# Patient Record
Sex: Male | Born: 1990 | Race: Black or African American | Hispanic: No | Marital: Single | Smoking: Never smoker
Health system: Southern US, Community
[De-identification: ages and names within clinical notes are randomized; demographics above are authoritative.]

## PROBLEM LIST (undated history)

## (undated) DIAGNOSIS — I10 Essential (primary) hypertension: Secondary | ICD-10-CM

## (undated) DIAGNOSIS — J45909 Unspecified asthma, uncomplicated: Secondary | ICD-10-CM

## (undated) HISTORY — PX: SHOULDER ARTHROSCOPY: SHX128

## (undated) HISTORY — PX: ORIF TIBIA & FIBULA FRACTURES: SHX2131

---
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
## 2010-06-18 IMAGING — CR DG CHEST 1V PORT
1 series · 1 of 1 positions shown · non-contrast
Comparison: 12/20/2008

CLINICAL DATA: Evaluate pulmonary infiltrates, effusions.

PORTABLE CHEST - 1 VIEW

[view not recorded]
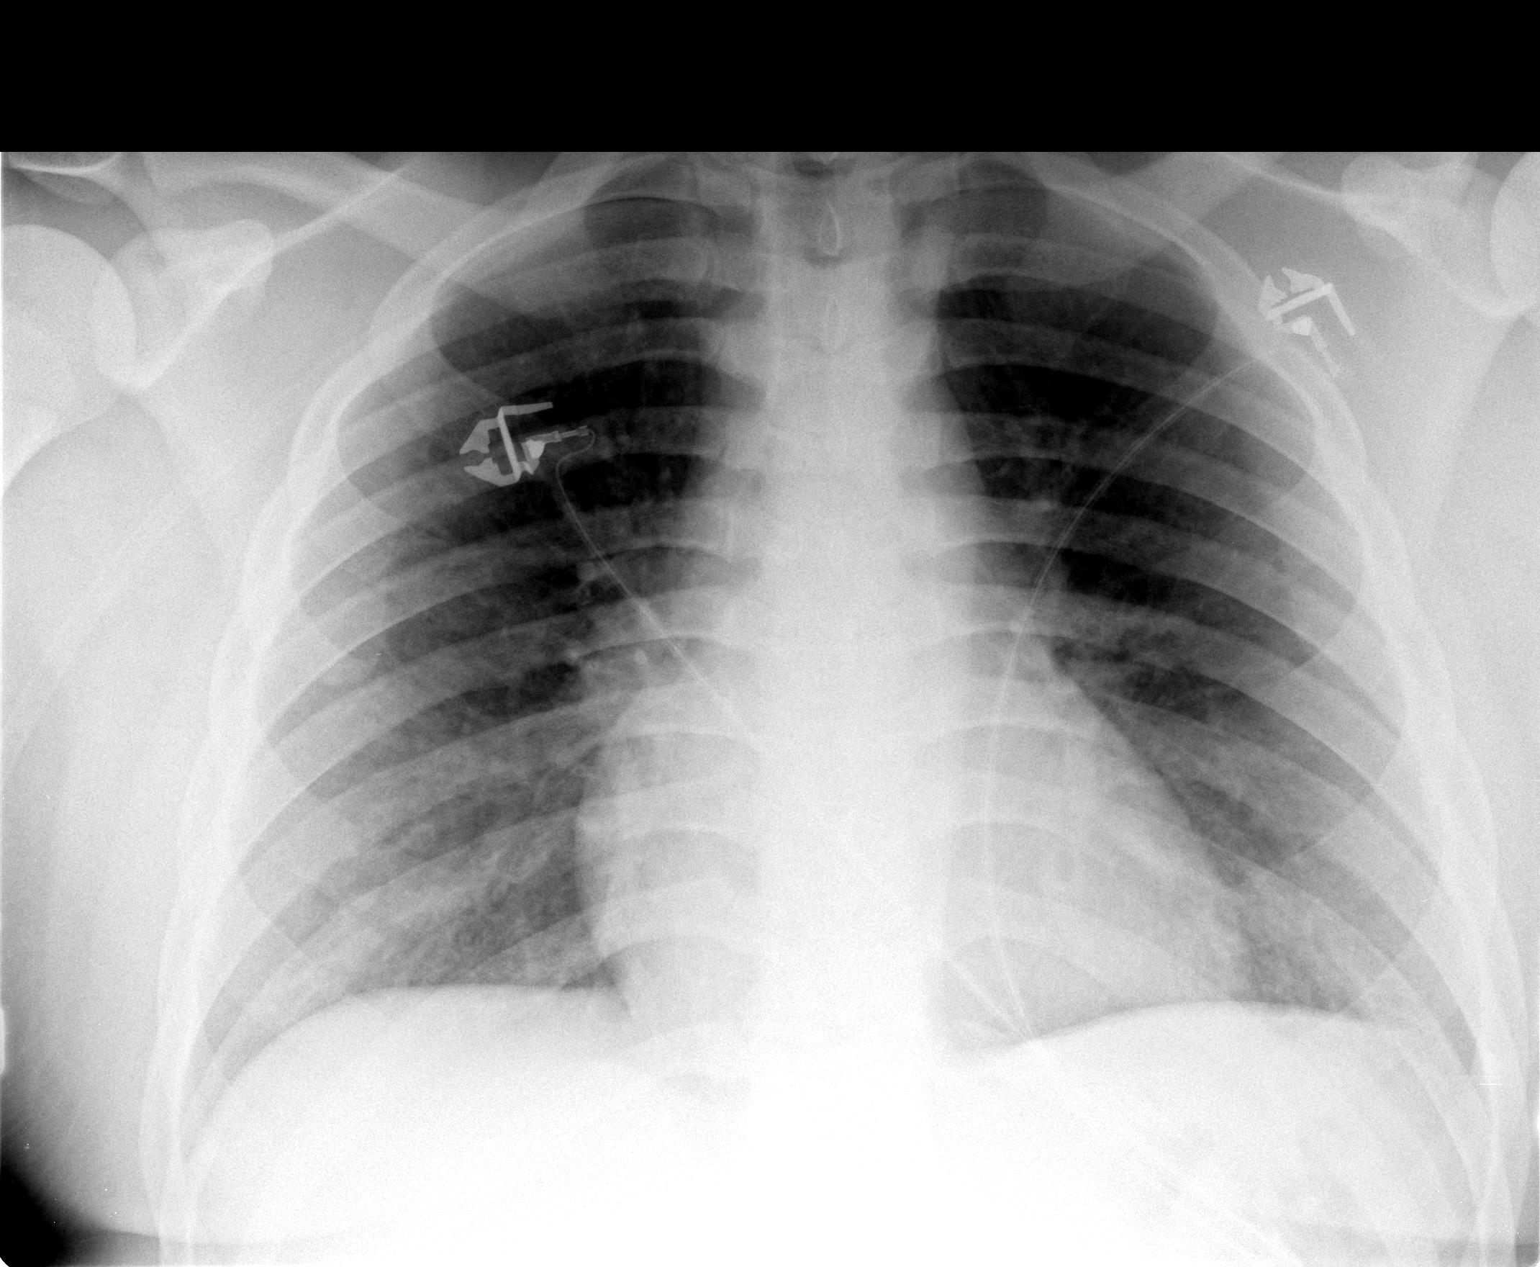

[1 of 1 positions shown; findings below may reference images not displayed]

FINDINGS: There is mild peribronchial thickening.  Previously noted
airspace disease has improved.  Heart is upper limits normal in
size.  No effusions or acute bony abnormality.
IMPRESSION: Improving bilateral airspace disease.

Heart upper limits of normal.

Mild bronchitic changes.

## 2010-07-15 LAB — URINALYSIS, ROUTINE W REFLEX MICROSCOPIC
Glucose, UA: NEGATIVE mg/dL
Hgb urine dipstick: NEGATIVE
Ketones, ur: NEGATIVE mg/dL
Protein, ur: NEGATIVE mg/dL

## 2010-08-03 LAB — POCT I-STAT 3, VENOUS BLOOD GAS (G3P V)
Acid-Base Excess: 4 mmol/L — ABNORMAL HIGH (ref 0.0–2.0)
Bicarbonate: 27.3 mEq/L — ABNORMAL HIGH (ref 20.0–24.0)
O2 Saturation: 100 %
TCO2: 28 mmol/L (ref 0–100)
pCO2, Ven: 37.2 mmHg — ABNORMAL LOW (ref 45.0–50.0)
pH, Ven: 7.473 — ABNORMAL HIGH (ref 7.250–7.300)
pO2, Ven: 155 mmHg — ABNORMAL HIGH (ref 30.0–45.0)

## 2010-08-03 LAB — CULTURE, BLOOD (ROUTINE X 2): Culture: NO GROWTH

## 2010-08-03 LAB — PROTIME-INR
INR: 1.2 (ref 0.00–1.49)
Prothrombin Time: 14.8 seconds (ref 11.6–15.2)

## 2010-08-03 LAB — EXPECTORATED SPUTUM ASSESSMENT W GRAM STAIN, RFLX TO RESP C

## 2010-08-03 LAB — D-DIMER, QUANTITATIVE: D-Dimer, Quant: 4.13 ug/mL-FEU — ABNORMAL HIGH (ref 0.00–0.48)

## 2010-08-03 LAB — CBC
HCT: 33.2 % — ABNORMAL LOW (ref 36.0–49.0)
HCT: 36 % (ref 36.0–49.0)
Hemoglobin: 12.4 g/dL (ref 12.0–16.0)
MCHC: 34.5 g/dL (ref 31.0–37.0)
MCV: 88.5 fL (ref 78.0–98.0)
Platelets: 190 10*3/uL (ref 150–400)
Platelets: 312 10*3/uL (ref 150–400)
RBC: 4.07 MIL/uL (ref 3.80–5.70)
RDW: 12.8 % (ref 11.4–15.5)
RDW: 12.9 % (ref 11.4–15.5)
WBC: 13.8 10*3/uL — ABNORMAL HIGH (ref 4.5–13.5)
WBC: 19.1 10*3/uL — ABNORMAL HIGH (ref 4.5–13.5)

## 2010-08-03 LAB — POCT I-STAT 3, ART BLOOD GAS (G3+)
Acid-Base Excess: 2 mmol/L (ref 0.0–2.0)
Bicarbonate: 26.6 mEq/L — ABNORMAL HIGH (ref 20.0–24.0)
O2 Saturation: 100 %
Patient temperature: 37
TCO2: 28 mmol/L (ref 0–100)
pCO2 arterial: 40.5 mmHg (ref 35.0–45.0)
pH, Arterial: 7.426 (ref 7.350–7.450)
pO2, Arterial: 238 mmHg — ABNORMAL HIGH (ref 80.0–100.0)

## 2010-08-03 LAB — LACTIC ACID, PLASMA: Lactic Acid, Venous: 1.4 mmol/L (ref 0.5–2.2)

## 2010-08-03 LAB — URINALYSIS, ROUTINE W REFLEX MICROSCOPIC
Ketones, ur: NEGATIVE mg/dL
Nitrite: NEGATIVE
Protein, ur: NEGATIVE mg/dL
Urobilinogen, UA: 0.2 mg/dL (ref 0.0–1.0)

## 2010-08-03 LAB — COMPREHENSIVE METABOLIC PANEL
ALT: 51 U/L (ref 0–53)
AST: 58 U/L — ABNORMAL HIGH (ref 0–37)
Albumin: 3.4 g/dL — ABNORMAL LOW (ref 3.5–5.2)
Alkaline Phosphatase: 86 U/L (ref 52–171)
BUN: 8 mg/dL (ref 6–23)
CO2: 27 mEq/L (ref 19–32)
Calcium: 8.4 mg/dL (ref 8.4–10.5)
Chloride: 101 mEq/L (ref 96–112)
Creatinine, Ser: 0.89 mg/dL (ref 0.4–1.5)
Glucose, Bld: 153 mg/dL — ABNORMAL HIGH (ref 70–99)
Potassium: 3.8 mEq/L (ref 3.5–5.1)
Sodium: 136 mEq/L (ref 135–145)
Total Bilirubin: 0.8 mg/dL (ref 0.3–1.2)
Total Protein: 6.8 g/dL (ref 6.0–8.3)

## 2010-08-03 LAB — POCT I-STAT 7, (LYTES, BLD GAS, ICA,H+H)
Acid-Base Excess: 4 mmol/L — ABNORMAL HIGH (ref 0.0–2.0)
Bicarbonate: 29.7 mEq/L — ABNORMAL HIGH (ref 20.0–24.0)
Calcium, Ion: 1.15 mmol/L (ref 1.12–1.32)
HCT: 31 % — ABNORMAL LOW (ref 36.0–49.0)
Hemoglobin: 10.5 g/dL — ABNORMAL LOW (ref 12.0–16.0)
O2 Saturation: 99 %
Patient temperature: 36.9
Potassium: 3.8 mEq/L (ref 3.5–5.1)
Sodium: 137 mEq/L (ref 135–145)
TCO2: 31 mmol/L (ref 0–100)
pCO2 arterial: 47.7 mmHg — ABNORMAL HIGH (ref 35.0–45.0)
pH, Arterial: 7.401 (ref 7.350–7.450)
pO2, Arterial: 135 mmHg — ABNORMAL HIGH (ref 80.0–100.0)

## 2010-08-03 LAB — C3 COMPLEMENT: C3 Complement: 190 mg/dL (ref 88–201)

## 2010-08-03 LAB — DIFFERENTIAL
Basophils Absolute: 0 10*3/uL (ref 0.0–0.1)
Basophils Relative: 0 % (ref 0–1)
Eosinophils Absolute: 0.3 10*3/uL (ref 0.0–1.2)
Eosinophils Relative: 2 % (ref 0–5)
Lymphocytes Relative: 4 % — ABNORMAL LOW (ref 24–48)
Lymphs Abs: 0.6 10*3/uL — ABNORMAL LOW (ref 1.1–4.8)
Monocytes Absolute: 0.8 10*3/uL (ref 0.2–1.2)
Monocytes Relative: 6 % (ref 3–11)
Neutro Abs: 12.1 10*3/uL — ABNORMAL HIGH (ref 1.7–8.0)
Neutrophils Relative %: 88 % — ABNORMAL HIGH (ref 43–71)

## 2010-08-03 LAB — APTT: aPTT: 25 seconds (ref 24–37)

## 2010-08-03 LAB — SALICYLATE LEVEL: Salicylate Lvl: <4 mg/dL (ref 2.8–20.0)

## 2010-08-03 LAB — HEPARIN ANTI-XA: Heparin LMW: 0.44 IU/mL

## 2010-08-03 LAB — FAT, QUALITATIVE, URINE: Fat, Qualitative Urine: NEGATIVE

## 2010-08-03 LAB — ABO/RH: ABO/RH(D): B POS

## 2010-08-03 LAB — LACTATE DEHYDROGENASE: LDH: 206 U/L (ref 94–250)

## 2010-08-03 LAB — ACETAMINOPHEN LEVEL: Acetaminophen (Tylenol), Serum: <10 ug/mL — ABNORMAL LOW (ref 10–30)

## 2010-08-03 LAB — SEDIMENTATION RATE: Sed Rate: 40 mm/hr — ABNORMAL HIGH (ref 0–16)

## 2010-09-10 NOTE — Discharge Summary (Signed)
NAME:  Kyle Doyle, Kyle Doyle NO.:  [id number]   MEDICAL RECORD NO.:  [id number]          PATIENT TYPE:  INP   LOCATION:  6148                         FACILITY:  [facility]   PHYSICIAN:  Dyann Ruddle, MDDATE OF BIRTH:  11-30-1990   DATE OF ADMISSION:  12/18/2008  DATE OF DISCHARGE:  12/23/2008                               DISCHARGE SUMMARY   REASON FOR HOSPITALIZATION:  Hemoptysis, respiratory distress, and chest  pain.   FINAL DIAGNOSIS:  Respiratory failure of unclear etiology.   BRIEF HOSPITAL COURSE:  Kyle Doyle is a previously healthy 20 year old  African American male who presented to the ED on December 18, 2008, with  fever, chest pain, shortness of breath, and oxygen saturation that were  66% on room air in the setting of being postop day #2 from an open  reduction internal fixation for left tib-fib fracture that he sustained  during a football game practice.  A spiral CT was done, given the  concern of pulmonary embolus.  CT was negative for filling defect,  although it did reveal multiple reticulonodular densities.  He was  treated with oxygen and max oxygen was nonrebreather which was weaned to  O2 face time of 60% which was weaned to 2 liters nasal cannula and  ultimately to room air.  He received 5 days of ceftriaxone, 5 days of  azithromycin, Solu-Medrol 40 mg IV twice daily which was weaned to  prednisone p.o. 40 mg once daily.  In addition, he received single doses  of Lasix and potassium chloride supplementation.  He received Lovenox  subcutaneous until discharge for DVT prophylaxis.  Hemoptysis slowly  resolved throughout hospital stay.  He had multiple labs that were not  consistent with an autoimmune etiology for his respiratory distress  including a normal ANCA, normal C3, C4, no urine sediments, normal  rheumatoid factor, normal ANA; however, it was unclear if his  improvement was secondary to steroids.  As far as infectious etiology  goes his  blood cultures remained no growth to date and his PPD is  negative.  The patient will have close outpatient follow-up to ensure  resolution of symptoms.   DISCHARGE WEIGHT:  129 kg.   DISCHARGE CONDITION:  Improved.   DISCHARGE DIET:  To resume prior diet.   DISCHARGE ACTIVITIES:  Per orthopaedics' request.   PROCEDURES AND OPERATIONS:  1. Spiral CT: negative for PE which showed multiple reticulonodular      densities.  2. Multiple chest x-rays which were consistent with pulmonary edema      much improved.  3. Echocardiogram, this was obtained for questionable cardiomegaly on      a chest x-ray.  The result is normal.  4. Lovenox therapy.   CONSULTANTS:  Adult Infectious Disease, Adult Pulmonology/Critical Care,  Adult Orthopaedics, Pediatric Cardiology, and Physical Therapy consults.   MEDICATIONS:  He has no continued home medications.   NEW MEDICATIONS:  1. Albuterol MDI with spacer 2 puffs 3-4 times a day as needed.  2. Prednisone per the following taper schedule, on day #0 which is the      day  of discharge, the patient is to take 40 mg by mouth twice      daily; on day #1, he is to take 30 mg by mouth twice daily; on day      #2, he is again to take 30 mg by mouth twice daily; on day #3, he      is to go down to 20 mg by mouth twice daily; on day #4, he will go      to 10 mg by mouth twice daily; on day #5, he will take 10 mg by      mouth only once daily; on day #6, he should not be taking any      further medications and at this point in time, he will discontinue      his GI prophylaxis.  3. Vicodin 5/500 1-2 tabs p.o. q.4 h. p.r.n. pain.  4. Pepcid 40 mg p.o. daily while on steroids.  5. Mucinex 200 or 400 mg every 4 hours for cough as needed.   DISCONTINUED MEDICATIONS:  None.   IMMUNIZATIONS:  He did not receive any immunizations.   PENDING RESULTS:  Blood culture which is no growth to 4 days at this  time.   FOLLOWUP ISSUES AND RECOMMENDATIONS:  Monitor  resolution of hemoptysis  and respiratory symptoms.   FOLLOWUP APPOINTMENTS:  Dr. Delford Field, Pulmonary Adult, his telephone  number for the office is [phone], the patient is to make an  appointment for 1 week after discharge.  Followup with his primary MD,  Dr. Susann Givens, his number is [phone], the patient will also call to  make an appointment sometime next week.  Last appointment is with Adult  Orthopaedics, Dr. Magnus Ivan, his office number is [phone], he will  follow up with Dr. Magnus Ivan in 1 to 2 weeks after discharge, the patient  will call to make an appointment.      Pediatrics Resident      Dyann Ruddle, MD  Electronically Signed    PR/MEDQ  D:  12/23/2008  T:  12/24/2008  Job:  [phone]

## 2011-03-17 ENCOUNTER — Encounter: Payer: Self-pay | Admitting: Family Medicine

## 2012-11-08 DIAGNOSIS — G4730 Sleep apnea, unspecified: Secondary | ICD-10-CM | POA: Insufficient documentation

## 2013-12-11 DIAGNOSIS — R55 Syncope and collapse: Secondary | ICD-10-CM | POA: Insufficient documentation

## 2013-12-11 DIAGNOSIS — I10 Essential (primary) hypertension: Secondary | ICD-10-CM | POA: Insufficient documentation

## 2013-12-13 DIAGNOSIS — I471 Supraventricular tachycardia, unspecified: Secondary | ICD-10-CM | POA: Insufficient documentation

## 2014-12-15 DIAGNOSIS — Z6841 Body Mass Index (BMI) 40.0 and over, adult: Secondary | ICD-10-CM | POA: Insufficient documentation

## 2014-12-16 DIAGNOSIS — K5901 Slow transit constipation: Secondary | ICD-10-CM | POA: Insufficient documentation

## 2016-09-26 ENCOUNTER — Emergency Department (HOSPITAL_COMMUNITY): Payer: Worker's Compensation

## 2016-09-26 ENCOUNTER — Emergency Department (HOSPITAL_COMMUNITY)
Admission: EM | Admit: 2016-09-26 | Discharge: 2016-09-26 | Disposition: A | Discharge status: Home / Self Care | Payer: Worker's Compensation | Attending: Emergency Medicine | Admitting: Emergency Medicine

## 2016-09-26 ENCOUNTER — Encounter (HOSPITAL_COMMUNITY): Payer: Self-pay

## 2016-09-26 DIAGNOSIS — R519 Headache, unspecified: Secondary | ICD-10-CM

## 2016-09-26 DIAGNOSIS — Y939 Activity, unspecified: Secondary | ICD-10-CM | POA: Insufficient documentation

## 2016-09-26 DIAGNOSIS — Z91010 Allergy to peanuts: Secondary | ICD-10-CM | POA: Diagnosis not present

## 2016-09-26 DIAGNOSIS — J45909 Unspecified asthma, uncomplicated: Secondary | ICD-10-CM | POA: Insufficient documentation

## 2016-09-26 DIAGNOSIS — R51 Headache: Secondary | ICD-10-CM | POA: Diagnosis not present

## 2016-09-26 DIAGNOSIS — Z79899 Other long term (current) drug therapy: Secondary | ICD-10-CM | POA: Diagnosis not present

## 2016-09-26 DIAGNOSIS — I10 Essential (primary) hypertension: Secondary | ICD-10-CM | POA: Insufficient documentation

## 2016-09-26 DIAGNOSIS — S3991XA Unspecified injury of abdomen, initial encounter: Secondary | ICD-10-CM | POA: Diagnosis present

## 2016-09-26 DIAGNOSIS — Y999 Unspecified external cause status: Secondary | ICD-10-CM | POA: Diagnosis not present

## 2016-09-26 DIAGNOSIS — R739 Hyperglycemia, unspecified: Secondary | ICD-10-CM | POA: Insufficient documentation

## 2016-09-26 DIAGNOSIS — Y929 Unspecified place or not applicable: Secondary | ICD-10-CM | POA: Diagnosis not present

## 2016-09-26 DIAGNOSIS — S301XXA Contusion of abdominal wall, initial encounter: Secondary | ICD-10-CM

## 2016-09-26 HISTORY — DX: Unspecified asthma, uncomplicated: J45.909

## 2016-09-26 HISTORY — DX: Essential (primary) hypertension: I10

## 2016-09-26 LAB — COMPREHENSIVE METABOLIC PANEL
ALT: 30 U/L (ref 17–63)
AST: 24 U/L (ref 15–41)
Albumin: 4.3 g/dL (ref 3.5–5.0)
Alkaline Phosphatase: 78 U/L (ref 38–126)
Anion gap: 8 (ref 5–15)
BILIRUBIN TOTAL: 0.5 mg/dL (ref 0.3–1.2)
BUN: 11 mg/dL (ref 6–20)
CO2: 30 mmol/L (ref 22–32)
CREATININE: 0.91 mg/dL (ref 0.61–1.24)
Calcium: 9.5 mg/dL (ref 8.9–10.3)
Chloride: 104 mmol/L (ref 101–111)
Glucose, Bld: 154 mg/dL — ABNORMAL HIGH (ref 65–99)
Potassium: 3.9 mmol/L (ref 3.5–5.1)
Sodium: 142 mmol/L (ref 135–145)
TOTAL PROTEIN: 7.9 g/dL (ref 6.5–8.1)

## 2016-09-26 LAB — CBC WITH DIFFERENTIAL/PLATELET
BASOS ABS: 0 10*3/uL (ref 0.0–0.1)
BASOS PCT: 0 %
EOS ABS: 0 10*3/uL (ref 0.0–0.7)
EOS PCT: 0 %
HCT: 42.5 % (ref 39.0–52.0)
HEMOGLOBIN: 14.3 g/dL (ref 13.0–17.0)
LYMPHS ABS: 2.4 10*3/uL (ref 0.7–4.0)
Lymphocytes Relative: 25 %
MCH: 28.8 pg (ref 26.0–34.0)
MCHC: 33.6 g/dL (ref 30.0–36.0)
MCV: 85.5 fL (ref 78.0–100.0)
Monocytes Absolute: 0.7 10*3/uL (ref 0.1–1.0)
Monocytes Relative: 8 %
NEUTROS PCT: 67 %
Neutro Abs: 6.4 10*3/uL (ref 1.7–7.7)
PLATELETS: 265 10*3/uL (ref 150–400)
RBC: 4.97 MIL/uL (ref 4.22–5.81)
RDW: 13.3 % (ref 11.5–15.5)
WBC: 9.6 10*3/uL (ref 4.0–10.5)

## 2016-09-26 LAB — URINALYSIS, ROUTINE W REFLEX MICROSCOPIC
BILIRUBIN URINE: NEGATIVE
Bacteria, UA: NONE SEEN
GLUCOSE, UA: NEGATIVE mg/dL
HGB URINE DIPSTICK: NEGATIVE
KETONES UR: NEGATIVE mg/dL
LEUKOCYTES UA: NEGATIVE
NITRITE: NEGATIVE
Protein, ur: 30 mg/dL — AB
SPECIFIC GRAVITY, URINE: 1.025 (ref 1.005–1.030)
pH: 5 (ref 5.0–8.0)

## 2016-09-26 MED ORDER — IOPAMIDOL (ISOVUE-300) INJECTION 61%: 100.0000 mL | Freq: Once | INTRAVENOUS | Status: DC | PRN

## 2016-09-26 MED ORDER — MORPHINE SULFATE (PF) 2 MG/ML IV SOLN
2.0000 mg | Freq: Once | INTRAVENOUS | Status: AC
  Administered 2016-09-26: 2 mg via INTRAVENOUS
  Filled 2016-09-26: qty 1

## 2016-09-26 MED ORDER — IOPAMIDOL (ISOVUE-300) INJECTION 61%
125.0000 mL | Freq: Once | INTRAVENOUS | Status: AC | PRN
  Administered 2016-09-26: 120 mL via INTRAVENOUS

## 2016-09-26 MED ORDER — ONDANSETRON HCL 4 MG/2ML IJ SOLN
4.0000 mg | Freq: Once | INTRAMUSCULAR | Status: AC
  Administered 2016-09-26: 4 mg via INTRAVENOUS
  Filled 2016-09-26: qty 2

## 2016-09-26 MED ORDER — IOPAMIDOL (ISOVUE-300) INJECTION 61%
INTRAVENOUS | Status: AC
  Filled 2016-09-26: qty 150

## 2016-09-26 MED ORDER — IBUPROFEN 800 MG PO TABS: 800.0000 mg | ORAL_TABLET | Freq: Three times a day (TID) | ORAL | 0 refills | Status: DC

## 2016-09-26 MED ORDER — MORPHINE SULFATE (PF) 4 MG/ML IV SOLN
4.0000 mg | Freq: Once | INTRAVENOUS | Status: AC
  Administered 2016-09-26: 4 mg via INTRAVENOUS
  Filled 2016-09-26: qty 1

## 2016-09-26 MED ORDER — HYDROCODONE-ACETAMINOPHEN 5-325 MG PO TABS: 1.0000 | ORAL_TABLET | ORAL | 0 refills | Status: DC | PRN

## 2016-09-26 MED ORDER — SODIUM CHLORIDE 0.9 % IV BOLUS (SEPSIS)
1000.0000 mL | Freq: Once | INTRAVENOUS | Status: AC
  Administered 2016-09-26: 1000 mL via INTRAVENOUS
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
## 2016-09-26 NOTE — ED Notes (Signed)
Pt called for triage, pt is in bathroom at this time.

## 2016-09-26 NOTE — Discharge Instructions (Signed)
Get your blood sugar rechecked by your PCP.

## 2016-09-26 NOTE — ED Notes (Signed)
Spoke with Kyle Doyle in HR and pt does not need drug screen.

## 2016-12-24 ENCOUNTER — Encounter: Payer: Self-pay | Admitting: Medical

## 2017-03-18 ENCOUNTER — Ambulatory Visit: Payer: BC Managed Care – PPO | Admitting: Family Medicine

## 2017-03-23 ENCOUNTER — Ambulatory Visit: Payer: BC Managed Care – PPO | Admitting: Family Medicine

## 2017-06-24 ENCOUNTER — Other Ambulatory Visit (HOSPITAL_COMMUNITY)
Admission: RE | Admit: 2017-06-24 | Discharge: 2017-06-24 | Disposition: A | Discharge status: Home / Self Care | Payer: BC Managed Care – PPO | Source: Ambulatory Visit | Attending: Family Medicine | Admitting: Family Medicine

## 2017-06-24 ENCOUNTER — Ambulatory Visit: Payer: BC Managed Care – PPO | Admitting: Family Medicine

## 2017-06-24 ENCOUNTER — Encounter: Payer: Self-pay | Admitting: Family Medicine

## 2017-06-24 VITALS — BP 146/84 | HR 75 | Temp 98.7°F | Ht 75.0 in | Wt 375.0 lb

## 2017-06-24 DIAGNOSIS — Z114 Encounter for screening for human immunodeficiency virus [HIV]: Secondary | ICD-10-CM | POA: Diagnosis not present

## 2017-06-24 DIAGNOSIS — M25562 Pain in left knee: Secondary | ICD-10-CM | POA: Diagnosis not present

## 2017-06-24 DIAGNOSIS — Z113 Encounter for screening for infections with a predominantly sexual mode of transmission: Secondary | ICD-10-CM | POA: Diagnosis present

## 2017-06-24 DIAGNOSIS — Z13220 Encounter for screening for lipoid disorders: Secondary | ICD-10-CM

## 2017-06-24 DIAGNOSIS — G4730 Sleep apnea, unspecified: Secondary | ICD-10-CM

## 2017-06-24 DIAGNOSIS — G8929 Other chronic pain: Secondary | ICD-10-CM

## 2017-06-24 DIAGNOSIS — R109 Unspecified abdominal pain: Secondary | ICD-10-CM

## 2017-06-24 DIAGNOSIS — R030 Elevated blood-pressure reading, without diagnosis of hypertension: Secondary | ICD-10-CM

## 2017-06-24 DIAGNOSIS — M25561 Pain in right knee: Secondary | ICD-10-CM

## 2017-06-24 LAB — COMPREHENSIVE METABOLIC PANEL
ALBUMIN: 4.3 g/dL (ref 3.5–5.2)
ALT: 28 U/L (ref 0–53)
AST: 21 U/L (ref 0–37)
Alkaline Phosphatase: 68 U/L (ref 39–117)
BILIRUBIN TOTAL: 0.4 mg/dL (ref 0.2–1.2)
BUN: 11 mg/dL (ref 6–23)
CO2: 28 mEq/L (ref 19–32)
CREATININE: 0.87 mg/dL (ref 0.40–1.50)
Calcium: 9.9 mg/dL (ref 8.4–10.5)
Chloride: 101 mEq/L (ref 96–112)
GFR: 136.15 mL/min (ref >60.00)
Glucose, Bld: 122 mg/dL — ABNORMAL HIGH (ref 70–99)
Potassium: 3.5 mEq/L (ref 3.5–5.1)
Sodium: 138 mEq/L (ref 135–145)
TOTAL PROTEIN: 7.7 g/dL (ref 6.0–8.3)

## 2017-06-24 LAB — CBC
HCT: 42.3 % (ref 39.0–52.0)
Hemoglobin: 14.4 g/dL (ref 13.0–17.0)
MCHC: 34.1 g/dL (ref 30.0–36.0)
MCV: 84.8 fl (ref 78.0–100.0)
Platelets: 308 10*3/uL (ref 150.0–400.0)
RBC: 4.99 Mil/uL (ref 4.22–5.81)
RDW: 13.2 % (ref 11.5–15.5)
WBC: 9 10*3/uL (ref 4.0–10.5)

## 2017-06-24 LAB — LIPID PANEL
CHOLESTEROL: 152 mg/dL (ref 0–200)
HDL: 42.7 mg/dL (ref >39.00)
LDL Cholesterol: 97 mg/dL (ref 0–99)
NONHDL: 109.4
Total CHOL/HDL Ratio: 4
Triglycerides: 61 mg/dL (ref 0.0–149.0)
VLDL: 12.2 mg/dL (ref 0.0–40.0)

## 2017-06-24 LAB — TSH: TSH: 2.35 u[IU]/mL (ref 0.35–4.50)

## 2017-06-24 LAB — POCT URINALYSIS DIP (MANUAL ENTRY)
BILIRUBIN UA: NEGATIVE
Glucose, UA: NEGATIVE mg/dL
Leukocytes, UA: NEGATIVE
NITRITE UA: NEGATIVE
PH UA: 5.5 (ref 5.0–8.0)
RBC UA: NEGATIVE
Spec Grav, UA: >=1.03 — AB (ref 1.010–1.025)
Urobilinogen, UA: 1 E.U./dL

## 2017-06-24 NOTE — Progress Notes (Signed)
Subjective:  Kyle Doyle is a 27 y.o. male who presents today with a chief complaint of elevated blood pressure and to establish care.   HPI:  Elevated Blood Pressure, Chronic problem, New Problem to this provider. Patient has been told that he has elevated BP in the past.  He has been on lisinopril in the past, however has not been on anything for the past several years.  He does not regularly check his blood pressure.  No chest pain or shortness of breath.  He has been trying to eat more healthy recently.  Morbid obesity, chronic problem, new to this provider Several year history.  He has been trying to eat healthier and work out more.  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  Wt Readings from Last 3 Encounters:  06/24/17 (!) 375 lb (170.1 kg)  09/26/16 (!) 390 lb (176.9 kg)  09/10/09 294 lb (133.4 kg) (>99 %, Z= 2.98)*   * Growth percentiles are based on CDC (Boys, 2-20 Years) data.   Snoring, chronic problem, new to this provider Several year history.  Has been told that he wakes up in the night gasping for air and short of breath.  Has significant family history of OSA.  Feels excessively sleepy during the day.  Does not feel refreshed upon waking.  Stable over last several months.  No other obvious alleviating or aggravating factors.  Left Flank Pain, Acute Issue Started a few weeks ago.  Pain located and left back and radiates in the left flank.  No hematuria.  No dysuria.  No treatments tried.  No obvious precipitating events.  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  He fractured his left tibia and fibula while in high school and had to have ORIF.  Still has significant pain in his bilateral knees.  Does not take anything currently for the pain.  Frequently has to stop to rest while walking.  STD screen Patient request screening tests for STDs today.  No penile discharge.  No  fevers or chills.  No other obvious symptoms.  ROS: Per HPI, otherwise a complete review of systems was negative.   PMH:  The following were reviewed and entered/updated in epic: Past Medical History:  Diagnosis Date  . Asthma   . Hypertension    Patient Active Problem List   Diagnosis Date Noted  . Elevated blood pressure reading 06/24/2017  . Morbid obesity (HCC) 06/24/2017  . Sleep-disordered breathing 06/24/2017  . Chronic pain of both knees 06/24/2017  . ALLERGIC RHINITIS 01/02/2009   Past Surgical History:  Procedure Laterality Date  . ORIF TIBIA & FIBULA FRACTURES    . SHOULDER ARTHROSCOPY      Mother and PGF with diabetes.   Medications- reviewed and updated No current outpatient medications on file.   No current facility-administered medications for this visit.    Allergies-reviewed and updated Allergies  Allergen Reactions  . Peanut-Containing Drug Products Shortness Of Breath and Swelling    TREE NUTS ONLY-Almonds, Pecans, and hazelnuts   Social History   Socioeconomic History  . Marital status: Single    Spouse name: None  . Number of children: None  . Years of education: None  . Highest education level: None  Social Needs  . Financial resource strain: None  . Food insecurity - worry: None  . Food insecurity - inability: None  . Transportation needs - medical: None  .  Transportation needs - non-medical: None  Occupational History  . None  Tobacco Use  . Smoking status: Never Smoker  . Smokeless tobacco: Never Used  Substance and Sexual Activity  . Alcohol use: Yes    Comment: occasional  . Drug use: No  . Sexual activity: None  Other Topics Concern  . None  Social History Narrative  . None   Objective:  Physical Exam: BP (!) 146/84 (BP Location: Left Arm, Patient Position: Sitting, Cuff Size: Large)   Pulse 75   Temp 98.7 F (37.1 C) (Oral)   Ht 6\' 3"  (1.905 m)   Wt (!) 375 lb (170.1 kg)   SpO2 96%   BMI 46.87 kg/m   Gen: NAD,  resting comfortably CV: RRR with no murmurs appreciated Pulm: NWOB, CTAB with no crackles, wheezes, or rhonchi GI: Morbidly obese, Normal bowel sounds present. Soft, Nontender, Nondistended. MSK: No edema, cyanosis, or clubbing noted.  Back without deformities or tenderness.  Strength 5 out of 5 in lower extremities.  Patellar reflexes 2+ and symmetric bilaterally. Skin: Warm, dry Neuro: Grossly normal, moves all extremities Psych: Normal affect and thought content  Results for orders placed or performed in visit on 06/24/17 (from the past 24 hour(s))  POCT urinalysis dipstick     Status: Abnormal   Collection Time: 06/24/17 10:07 AM  Result Value Ref Range   Color, UA yellow yellow   Clarity, UA clear clear   Glucose, UA negative negative mg/dL   Bilirubin, UA negative negative   Ketones, POC UA trace (5) (A) negative mg/dL   Spec Grav, UA >=9.604 (A) 1.010 - 1.025   Blood, UA negative negative   pH, UA 5.5 5.0 - 8.0   Protein Ur, POC trace (A) negative mg/dL   Urobilinogen, UA 1.0 0.2 or 1.0 E.U./dL   Nitrite, UA Negative Negative   Leukocytes, UA Negative Negative     Assessment/Plan:  Elevated blood pressure reading Slightly above goal today.  He has several modifiable risk factors including obesity and likely OSA.  He deferred starting medication today.  Discussed lifestyle interventions.  Will place referral for sleep study-see below problem.  He will follow-up with me in a few weeks to recheck.  Morbid obesity (HCC) BMI 46.87 today.  Discussed lifestyle modifications.  He has lost about 15 pounds over the last 7-8 months.  Congratulated patient on this and encouraged continued lifestyle modifications.  He will follow-up with me in a few weeks.  Sleep-disordered breathing Patient at high risk for OSA.  Will place referral for sleep study.  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   Discussed conservative therapies including weight loss.  Handicap placard form filled out for patient today as he is unable to walk for more than 200 feet without stopping to rest.  Left flank pain Likely musculoskeletal secondary to obesity.  Will check UA to rule out infection and nephrolithiasis.  STD screen  Check HIV antibody.  Check RPR.  Check urine GC/chlamydia/trichomonas.  Preventive healthcare Check lipid panel today.  Patient will return soon for his CPE.  [name]. [name], MD 06/24/2017 10:48 AM

## 2017-06-24 NOTE — Assessment & Plan Note (Signed)
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  Discussed conservative therapies including weight loss.  Handicap placard form filled out for patient today as he is unable to walk for more than 200 feet without stopping to rest.

## 2017-06-24 NOTE — Assessment & Plan Note (Signed)
BMI 46.87 today.  Discussed lifestyle modifications.  He has lost about 15 pounds over the last 7-8 months.  Congratulated patient on this and encouraged continued lifestyle modifications.  He will follow-up with me in a few weeks.

## 2017-06-24 NOTE — Assessment & Plan Note (Signed)
Slightly above goal today.  He has several modifiable risk factors including obesity and likely OSA.  He deferred starting medication today.  Discussed lifestyle interventions.  Will place referral for sleep study-see below problem.  He will follow-up with me in a few weeks to recheck.

## 2017-06-24 NOTE — Patient Instructions (Signed)
We will check blood work today.  I put in a referral for your sleep study.   Come back to see me in a few weeks for your physical.  Take care, Dr Jimmey RalphParker   DASH Eating Plan DASH stands for "Dietary Approaches to Stop Hypertension." The DASH eating plan is a healthy eating plan that has been shown to reduce high blood pressure (hypertension). It may also reduce your risk for type 2 diabetes, heart disease, and stroke. The DASH eating plan may also help with weight loss. What are tips for following this plan? General guidelines  Avoid eating more than 2,300 mg (milligrams) of salt (sodium) a day. If you have hypertension, you may need to reduce your sodium intake to 1,500 mg a day.  Limit alcohol intake to no more than 1 drink a day for nonpregnant women and 2 drinks a day for men. One drink equals 12 oz of beer, 5 oz of wine, or 1 oz of hard liquor.  Work with your health care provider to maintain a healthy body weight or to lose weight. Ask what an ideal weight is for you.  Get at least 30 minutes of exercise that causes your heart to beat faster (aerobic exercise) most days of the week. Activities may include walking, swimming, or biking.  Work with your health care provider or diet and nutrition specialist (dietitian) to adjust your eating plan to your individual calorie needs. Reading food labels  Check food labels for the amount of sodium per serving. Choose foods with less than 5 percent of the Daily Value of sodium. Generally, foods with less than 300 mg of sodium per serving fit into this eating plan.  To find whole grains, look for the word "whole" as the first word in the ingredient list. Shopping  Buy products labeled as "low-sodium" or "no salt added."  Buy fresh foods. Avoid canned foods and premade or frozen meals. Cooking  Avoid adding salt when cooking. Use salt-free seasonings or herbs instead of table salt or sea salt. Check with your health care provider or  pharmacist before using salt substitutes.  Do not fry foods. Cook foods using healthy methods such as baking, boiling, grilling, and broiling instead.  Cook with heart-healthy oils, such as olive, canola, soybean, or sunflower oil. Meal planning   Eat a balanced diet that includes: ? 5 or more servings of fruits and vegetables each day. At each meal, try to fill half of your plate with fruits and vegetables. ? Up to 6-8 servings of whole grains each day. ? Less than 6 oz of lean meat, poultry, or fish each day. A 3-oz serving of meat is about the same size as a deck of cards. One egg equals 1 oz. ? 2 servings of low-fat dairy each day. ? A serving of nuts, seeds, or beans 5 times each week. ? Heart-healthy fats. Healthy fats called Omega-3 fatty acids are found in foods such as flaxseeds and coldwater fish, like sardines, salmon, and mackerel.  Limit how much you eat of the following: ? Canned or prepackaged foods. ? Food that is high in trans fat, such as fried foods. ? Food that is high in saturated fat, such as fatty meat. ? Sweets, desserts, sugary drinks, and other foods with added sugar. ? Full-fat dairy products.  Do not salt foods before eating.  Try to eat at least 2 vegetarian meals each week.  Eat more home-cooked food and less restaurant, buffet, and fast food.  When  eating at a restaurant, ask that your food be prepared with less salt or no salt, if possible. What foods are recommended? The items listed may not be a complete list. Talk with your dietitian about what dietary choices are best for you. Grains Whole-grain or whole-wheat bread. Whole-grain or whole-wheat pasta. Brown rice. [place]. Bulgur. Whole-grain and low-sodium cereals. Pita bread. Low-fat, low-sodium crackers. Whole-wheat flour tortillas. Vegetables Fresh or frozen vegetables (raw, steamed, roasted, or grilled). Low-sodium or reduced-sodium tomato and vegetable juice. Low-sodium or  reduced-sodium tomato sauce and tomato paste. Low-sodium or reduced-sodium canned vegetables. Fruits All fresh, dried, or frozen fruit. Canned fruit in natural juice (without added sugar). Meat and other protein foods Skinless chicken or [place]. Ground chicken or [place]. Pork with fat trimmed off. Fish and seafood. Egg whites. Dried beans, peas, or lentils. Unsalted nuts, nut butters, and seeds. Unsalted canned beans. Lean cuts of beef with fat trimmed off. Low-sodium, lean deli meat. Dairy Low-fat (1%) or fat-free (skim) milk. Fat-free, low-fat, or reduced-fat cheeses. Nonfat, low-sodium ricotta or cottage cheese. Low-fat or nonfat yogurt. Low-fat, low-sodium cheese. Fats and oils Soft margarine without trans fats. Vegetable oil. Low-fat, reduced-fat, or light mayonnaise and salad dressings (reduced-sodium). Canola, safflower, olive, soybean, and sunflower oils. Avocado. Seasoning and other foods Herbs. Spices. Seasoning mixes without salt. Unsalted popcorn and pretzels. Fat-free sweets. What foods are not recommended? The items listed may not be a complete list. Talk with your dietitian about what dietary choices are best for you. Grains Baked goods made with fat, such as croissants, muffins, or some breads. Dry pasta or rice meal packs. Vegetables Creamed or fried vegetables. Vegetables in a cheese sauce. Regular canned vegetables (not low-sodium or reduced-sodium). Regular canned tomato sauce and paste (not low-sodium or reduced-sodium). Regular tomato and vegetable juice (not low-sodium or reduced-sodium). Angie Fava. Olives. Fruits Canned fruit in a light or heavy syrup. Fried fruit. Fruit in cream or butter sauce. Meat and other protein foods Fatty cuts of meat. Ribs. Fried meat. Berniece Salines. Sausage. Bologna and other processed lunch meats. Salami. Fatback. Hotdogs. Bratwurst. Salted nuts and seeds. Canned beans with added salt. Canned or smoked fish. Whole eggs or egg yolks. Chicken or [place]  with skin. Dairy Whole or 2% milk, cream, and half-and-half. Whole or full-fat cream cheese. Whole-fat or sweetened yogurt. Full-fat cheese. Nondairy creamers. Whipped toppings. Processed cheese and cheese spreads. Fats and oils Butter. Stick margarine. Lard. Shortening. Ghee. Bacon fat. Tropical oils, such as coconut, palm kernel, or palm oil. Seasoning and other foods Salted popcorn and pretzels. Onion salt, garlic salt, seasoned salt, table salt, and sea salt. Worcestershire sauce. Tartar sauce. Barbecue sauce. Teriyaki sauce. Soy sauce, including reduced-sodium. Steak sauce. Canned and packaged gravies. Fish sauce. Oyster sauce. Cocktail sauce. Horseradish that you find on the shelf. Ketchup. Mustard. Meat flavorings and tenderizers. Bouillon cubes. Hot sauce and Tabasco sauce. Premade or packaged marinades. Premade or packaged taco seasonings. Relishes. Regular salad dressings. Where to find more information:  National Heart, Lung, and [place]: https://[place]-eaton.com/  American Heart Association: www.heart.org Summary  The DASH eating plan is a healthy eating plan that has been shown to reduce high blood pressure (hypertension). It may also reduce your risk for type 2 diabetes, heart disease, and stroke.  With the DASH eating plan, you should limit salt (sodium) intake to 2,300 mg a day. If you have hypertension, you may need to reduce your sodium intake to 1,500 mg a day.  When on the DASH eating plan, aim  to eat more fresh fruits and vegetables, whole grains, lean proteins, low-fat dairy, and heart-healthy fats.  Work with your health care provider or diet and nutrition specialist (dietitian) to adjust your eating plan to your individual calorie needs. This information is not intended to replace advice given to you by your health care provider. Make sure you discuss any questions you have with your health care provider. Document Released: 04/03/2011 Document Revised: 04/07/2016  Document Reviewed: 04/07/2016 Elsevier Interactive Patient Education  [employer].

## 2017-06-24 NOTE — Assessment & Plan Note (Signed)
Patient at high risk for OSA.  Will place referral for sleep study.

## 2017-06-25 ENCOUNTER — Telehealth: Payer: Self-pay | Admitting: Family Medicine

## 2017-06-25 LAB — URINE CYTOLOGY ANCILLARY ONLY
Chlamydia: NEGATIVE
Neisseria Gonorrhea: NEGATIVE
TRICH (WINDOWPATH): NEGATIVE

## 2017-06-25 LAB — RPR: RPR: NONREACTIVE

## 2017-06-25 LAB — HIV ANTIBODY (ROUTINE TESTING W REFLEX): HIV 1&2 Ab, 4th Generation: NONREACTIVE

## 2017-06-25 NOTE — Telephone Encounter (Signed)
Per Dr. Jimmey RalphParker, we can write a note stating patient was seen in the office on 06/24/2017.  Dr. Jimmey RalphParker did not write the patient out of work today.  I have notified the patient that a note will be written and he should be able to print if from his MyChart.  A copy will also be placed up front if he wants to pick it up.

## 2017-06-25 NOTE — Telephone Encounter (Signed)
Copied from CRM [phone]. Topic: Quick Communication - See Telephone Encounter >> Jun 25, 2017  3:55 PM Raquel SarnaHayes, Teresa G wrote: Pt is needing an excuse of absence note for work for today.  Pt forgot to mention it to Dr. Jimmey RalphParker at yesterday's visit. Please call pt to let him know by end of today if possible.

## 2017-06-25 NOTE — Telephone Encounter (Signed)
See note

## 2017-06-25 NOTE — Progress Notes (Signed)
Dr Lavone NeriParker's interpretation of your lab work:  Your STD tests were all negative. Your thyroid test was normal. Your cholesterol levels were normal Your blood counts were normal Your blood sugar was a little high.  We do not need to start medications at this point however you should continue working on diet, exercise, and weight loss to help with this. Your electrolytes, kidney function, and liver function were all normal. You do not have any signs of kidney stone or infection in your urine sample.  If you have any additional questions, please give us a call or send us a message through [facility].  Take care, Dr Jimmey RalphParker

## 2017-06-26 ENCOUNTER — Encounter: Payer: Self-pay | Admitting: Family Medicine

## 2017-07-08 ENCOUNTER — Ambulatory Visit (INDEPENDENT_AMBULATORY_CARE_PROVIDER_SITE_OTHER): Payer: BC Managed Care – PPO | Admitting: Family Medicine

## 2017-07-08 ENCOUNTER — Encounter: Payer: Self-pay | Admitting: Family Medicine

## 2017-07-08 VITALS — BP 138/78 | HR 92 | Temp 98.6°F | Ht 75.0 in | Wt 375.6 lb

## 2017-07-08 DIAGNOSIS — F419 Anxiety disorder, unspecified: Secondary | ICD-10-CM

## 2017-07-08 DIAGNOSIS — L918 Other hypertrophic disorders of the skin: Secondary | ICD-10-CM | POA: Diagnosis not present

## 2017-07-08 DIAGNOSIS — F22 Delusional disorders: Secondary | ICD-10-CM

## 2017-07-08 DIAGNOSIS — M2142 Flat foot [pes planus] (acquired), left foot: Secondary | ICD-10-CM

## 2017-07-08 DIAGNOSIS — Z0001 Encounter for general adult medical examination with abnormal findings: Secondary | ICD-10-CM | POA: Diagnosis not present

## 2017-07-08 DIAGNOSIS — M2141 Flat foot [pes planus] (acquired), right foot: Secondary | ICD-10-CM

## 2017-07-08 NOTE — Patient Instructions (Addendum)
I will place a referral to psychiatry.  Please ask your insurance about the skin tag removal.  Please try arch support in your shoes.   Preventive Care 18-39 Years, Male Preventive care refers to lifestyle choices and visits with your health care provider that can promote health and wellness. What does preventive care include?  A yearly physical exam. This is also called an annual well check.  Dental exams once or twice a year.  Routine eye exams. Ask your health care provider how often you should have your eyes checked.  Personal lifestyle choices, including: ? Daily care of your teeth and gums. ? Regular physical activity. ? Eating a healthy diet. ? Avoiding tobacco and drug use. ? Limiting alcohol use. ? Practicing safe sex. What happens during an annual well check? The services and screenings done by your health care provider during your annual well check will depend on your age, overall health, lifestyle risk factors, and family history of disease. Counseling Your health care provider may ask you questions about your:  Alcohol use.  Tobacco use.  Drug use.  Emotional well-being.  Home and relationship well-being.  Sexual activity.  Eating habits.  Work and work Statistician.  Screening You may have the following tests or measurements:  Height, weight, and BMI.  Blood pressure.  Lipid and cholesterol levels. These may be checked every 5 years starting at age 76.  Diabetes screening. This is done by checking your blood sugar (glucose) after you have not eaten for a while (fasting).  Skin check.  Hepatitis C blood test.  Hepatitis B blood test.  Sexually transmitted disease (STD) testing.  Discuss your test results, treatment options, and if necessary, the need for more tests with your health care provider. Vaccines Your health care provider may recommend certain vaccines, such as:  Influenza vaccine. This is recommended every year.  Tetanus,  diphtheria, and acellular pertussis (Tdap, Td) vaccine. You may need a Td booster every 10 years.  Varicella vaccine. You may need this if you have not been vaccinated.  HPV vaccine. If you are 33 or younger, you may need three doses over 6 months.  Measles, mumps, and rubella (MMR) vaccine. You may need at least one dose of MMR.You may also need a second dose.  Pneumococcal 13-valent conjugate (PCV13) vaccine. You may need this if you have certain conditions and have not been vaccinated.  Pneumococcal polysaccharide (PPSV23) vaccine. You may need one or two doses if you smoke cigarettes or if you have certain conditions.  Meningococcal vaccine. One dose is recommended if you are age 81-21 years and a first-year college student living in a residence hall, or if you have one of several medical conditions. You may also need additional booster doses.  Hepatitis A vaccine. You may need this if you have certain conditions or if you travel or work in places where you may be exposed to hepatitis A.  Hepatitis B vaccine. You may need this if you have certain conditions or if you travel or work in places where you may be exposed to hepatitis B.  Haemophilus influenzae type b (Hib) vaccine. You may need this if you have certain risk factors.  Talk to your health care provider about which screenings and vaccines you need and how often you need them. This information is not intended to replace advice given to you by your health care provider. Make sure you discuss any questions you have with your health care provider. Document Released: 06/10/2001 Document Revised:  01/02/2016 Document Reviewed: 02/13/2015 Elsevier Interactive Patient Education  [employer].

## 2017-07-08 NOTE — Progress Notes (Signed)
Subjective:  Kyle Doyle is a 27 y.o. male who presents today for his annual comprehensive physical exam.    HPI: He has 3 complaints today outlined below. 1.  Skin tag.  Several year history.  Located on his back.  Occasionally gets irritated and bleeds. 2.  Paranoia and anxiety.  The symptoms have worsened over the last few months.  Is never discussed this with any other healthcare provider.  He occasionally feels like people are out to get him.  Does not know anybody specifically but is constantly worried that somebody is trying to hurt him.  He will occasionally see things moving at his home that he knows no one else can see. 3.  Bilateral foot pain.  Several year history.  Has been told that he has flat arches in the past and thinks it is related to this.  He has not tried anything for this.  Lifestyle Diet: No specific diets.  Exercise: No exercises.   Depression screen PHQ 2/9 06/24/2017  Decreased Interest 0  Down, Depressed, Hopeless 0  PHQ - 2 Score 0   Health Maintenance Due  Topic Date Due  . TETANUS/TDAP  03/26/2010    ROS: Per HPI, otherwise a complete review of systems was negative.   PMH:  The following were reviewed and entered/updated in epic: Past Medical History:  Diagnosis Date  . Asthma   . Hypertension    Patient Active Problem List   Diagnosis Date Noted  . Elevated blood pressure reading 06/24/2017  . Morbid obesity (HCC) 06/24/2017  . Sleep-disordered breathing 06/24/2017  . Chronic pain of both knees 06/24/2017  . ALLERGIC RHINITIS 01/02/2009   Past Surgical History:  Procedure Laterality Date  . ORIF TIBIA & FIBULA FRACTURES    . SHOULDER ARTHROSCOPY      History reviewed. No pertinent family history.  Medications- reviewed and updated No current outpatient medications on file.   No current facility-administered medications for this visit.     Allergies-reviewed and updated Allergies  Allergen Reactions  . Peanut-Containing  Drug Products Shortness Of Breath and Swelling    TREE NUTS ONLY-Almonds, Pecans, and hazelnuts    Social History   Socioeconomic History  . Marital status: Single    Spouse name: None  . Number of children: None  . Years of education: None  . Highest education level: None  Social Needs  . Financial resource strain: None  . Food insecurity - worry: None  . Food insecurity - inability: None  . Transportation needs - medical: None  . Transportation needs - non-medical: None  Occupational History  . None  Tobacco Use  . Smoking status: Never Smoker  . Smokeless tobacco: Never Used  Substance and Sexual Activity  . Alcohol use: Yes    Comment: occasional  . Drug use: No  . Sexual activity: None  Other Topics Concern  . None  Social History Narrative  . None    Objective:  Physical Exam: BP 138/78 (BP Location: Left Arm, Patient Position: Sitting, Cuff Size: Large)   Pulse 92   Temp 98.6 F (37 C) (Oral)   Ht 6\' 3"  (1.905 m)   Wt (!) 375 lb 9.6 oz (170.4 kg)   SpO2 97%   BMI 46.95 kg/m   Body mass index is 46.95 kg/m. Wt Readings from Last 3 Encounters:  07/08/17 (!) 375 lb 9.6 oz (170.4 kg)  06/24/17 (!) 375 lb (170.1 kg)  09/26/16 (!) 390 lb (176.9 kg)  Gen: NAD, resting comfortably HEENT: TMs normal bilaterally. OP clear. No thyromegaly noted.  CV: RRR with no murmurs appreciated Pulm: NWOB, CTAB with no crackles, wheezes, or rhonchi GI: Normal bowel sounds present. Soft, Nontender, Nondistended. MSK: Pes planus in feet bilaterally.  No cyanosis. Skin: Warm.  Dry.  Pedunculated 1 cm skin tag on upper back. Neuro: CN2-12 grossly intact. Strength 5/5 in upper and lower extremities. Reflexes symmetric and intact bilaterally.  Psych: Normal affect.  No apparent AVH.  No SI or HI.  Assessment/Plan:  Paranoia and anxiety Concern for psychotic disorder.  Will place referral to psychiatry.  Does not have any emergent signs or symptoms today that would  necessitate emergent care.  Skin tag Patient will return for removal.  Pes planus Likely the source of patient's bilateral foot pain.  Recommended him try footwear with good arch support to see if this helps with his symptoms.  If not, may need referral to sports medicine for custom orthotics.  Obesity Discussed lifestyle modifications.  Consider nutrition referral in the future.  Preventative Healthcare: Recently had lipid screening.  Reports that he is up-to-date on his tetanus shot.  Patient Counseling:  -Nutrition: Stressed importance of moderation in sodium/caffeine intake, saturated fat and cholesterol, caloric balance, sufficient intake of fresh fruits, vegetables, and fiber.  -Stressed the importance of regular exercise.   -Substance Abuse: Discussed cessation/primary prevention of tobacco, alcohol, or other drug use; driving or other dangerous activities under the influence; availability of treatment for abuse.   -Injury prevention: Discussed safety belts, safety helmets, smoke detector, smoking near bedding or upholstery.   -Sexuality: Discussed sexually transmitted diseases, partner selection, use of condoms, avoidance of unintended pregnancy and contraceptive alternatives.   -Dental health: Discussed importance of regular tooth brushing, flossing, and dental visits.  -Health maintenance and immunizations reviewed. Please refer to Health maintenance section.  Return to care in 1 year for next preventative visit.   Katina Degreealeb M. Jimmey RalphParker, MD 07/08/2017 10:37 AM

## 2017-07-28 ENCOUNTER — Encounter: Payer: Self-pay | Admitting: Neurology

## 2017-07-28 ENCOUNTER — Ambulatory Visit: Payer: BC Managed Care – PPO | Admitting: Neurology

## 2017-07-28 VITALS — BP 179/113 | HR 114 | Ht 74.0 in | Wt 370.0 lb

## 2017-07-28 DIAGNOSIS — G4726 Circadian rhythm sleep disorder, shift work type: Secondary | ICD-10-CM

## 2017-07-28 DIAGNOSIS — R400 Somnolence: Secondary | ICD-10-CM | POA: Diagnosis not present

## 2017-07-28 DIAGNOSIS — R030 Elevated blood-pressure reading, without diagnosis of hypertension: Secondary | ICD-10-CM | POA: Diagnosis not present

## 2017-07-28 DIAGNOSIS — Z820 Family history of epilepsy and other diseases of the nervous system: Secondary | ICD-10-CM | POA: Diagnosis not present

## 2017-07-28 DIAGNOSIS — Z6841 Body Mass Index (BMI) 40.0 and over, adult: Secondary | ICD-10-CM | POA: Diagnosis not present

## 2017-07-28 DIAGNOSIS — G4733 Obstructive sleep apnea (adult) (pediatric): Secondary | ICD-10-CM | POA: Diagnosis not present

## 2017-07-28 NOTE — Patient Instructions (Addendum)
Thank you for choosing [facility] for your sleep related care! It was nice to meet you today! I appreciate that you entrust me with your sleep related healthcare concerns. I hope, I was able to address at least some of your concerns today, and that I can help you feel reassured and also get better.    Here is what we discussed today and what we came up with as our plan for you:    Based on your symptoms and your exam I believe you are still at risk for obstructive sleep apnea and would benefit from reevaluation as it has been some years. Therefore, I think we should proceed with a sleep study to determine how severe your sleep apnea is. If you have more than mild OSA, I want you to consider ongoing treatment with CPAP. Please remember, the risks and ramifications of moderate to severe obstructive sleep apnea or OSA are: Cardiovascular disease, including congestive heart failure, stroke, difficult to control hypertension, arrhythmias, and even type 2 diabetes has been linked to untreated OSA. Sleep apnea causes disruption of sleep and sleep deprivation in m ost cases, which, in turn, can cause recurrent headaches, problems with memory, mood, concentration, focus, and vigilance. Most people with untreated sleep apnea report excessive daytime sleepiness, which can affect their ability to drive. Please do not drive if you feel sleepy.   I will likely see you back after your sleep study to go over the test results and where to go from there. We will call you after your sleep study to advise about the results (most likely, you will hear from KimmswickKristen, my nurse) and to set up an appointment at the time, as necessary.    Our sleep lab administrative assistant will call you to schedule your sleep study. If you don't hear back from her by about 2 weeks from now, please feel free to call her at [phone]. You can leave a message with your phone number and concerns, if you get the voicemail box.  She will call back as soon as possible.

## 2017-07-28 NOTE — Progress Notes (Signed)
Subjective:    Patient ID: Kyle Doyle is a 27 y.o. male.  HPI     Kyle Foley, MD, PhD [facility] [address], [address] [address] [place], [place] [address]  Dear Dr. [name],   I saw your patient, Kyle Doyle, upon your kind request in my neurologic clinic today for initial consultation of his sleep disorder, in particular, concern for underlying obstructive sleep apnea. The patient is unaccompanied today. As you know, Kyle Doyle is a 27 year old right-handed gentleman with an underlying medical history of hypertension, asthma, knee pain, allergic rhinitis and morbid obesity with a BMI of over 45, who reports snoring and excessive daytime somnolence. I reviewed your office note from 06/24/2017. He had a sleep study some 4 or 5 years ago but no longer has a CPAP machine. He uses CPAP while in college. Prior sleep study results are not available for my review today. His Epworth sleepiness score is 23 out of 24 today, fatigue score is 51 out of 63. He is single and lives alone. He did not smoke cigarettes but uses tobacco occasionally, drinks alcohol in the form of beer, one per week on average and caffeine in the form of coffee 3-4 cups per day, typically while at work. Of note, he works night shift, 4:30 PM to 6 AM. He works 2 days on and 5 days off 1 week, then 2 days off in 5 days on the next week. He has a strong family history of obstructive sleep apnea including his father and his paternal uncle. Patient's bedtime is between 7:30 and 8 AM typically. He does not often sleepy on 1 PM. He does not wake up rested. He has gained weight in the past few years.    His Past Medical History Is Significant For: Past Medical History:  Diagnosis Date  . Asthma   . Hypertension     His Past Surgical History Is Significant For: Past Surgical History:  Procedure Laterality Date  . ORIF TIBIA & FIBULA FRACTURES    . SHOULDER ARTHROSCOPY      His Family History Is  Significant For: No family history on file.  His Social History Is Significant For: Social History   Socioeconomic History  . Marital status: Single    Spouse name: Not on file  . Number of children: Not on file  . Years of education: Not on file  . Highest education level: Not on file  Occupational History  . Not on file  Social Needs  . Financial resource strain: Not on file  . Food insecurity:    Worry: Not on file    Inability: Not on file  . Transportation needs:    Medical: Not on file    Non-medical: Not on file  Tobacco Use  . Smoking status: Never Smoker  . Smokeless tobacco: Never Used  Substance and Sexual Activity  . Alcohol use: Yes    Comment: occasional  . Drug use: No  . Sexual activity: Not on file  Lifestyle  . Physical activity:    Days per week: Not on file    Minutes per session: Not on file  . Stress: Not on file  Relationships  . Social connections:    Talks on phone: Not on file    Gets together: Not on file    Attends religious service: Not on file    Active member of club or organization: Not on file    Attends meetings of clubs or  organizations: Not on file    Relationship status: Not on file  Other Topics Concern  . Not on file  Social History Narrative  . Not on file    His Allergies Are:  Allergies  Allergen Reactions  . Peanut-Containing Drug Products Shortness Of Breath and Swelling    TREE NUTS ONLY-Almonds, Pecans, and hazelnuts  :   His Current Medications Are:  No outpatient encounter medications on file as of 07/28/2017.   No facility-administered encounter medications on file as of 07/28/2017.   :  Review of Systems:  Out of a complete 14 point review of systems, all are reviewed and negative with the exception of these symptoms as listed below: Review of Systems  Neurological:       Pt presents today to discuss his sleep. Pt has had a cpap, used it in college, but no longer has it and is unsure of what happened to  his cpap. Pt's last sleep study was over 5 years ago. Pt endorses snoring.  Epworth Sleepiness Scale 0= would never doze 1= slight chance of dozing 2= moderate chance of dozing 3= high chance of dozing  Sitting and reading: 3 Watching TV: 3 Sitting inactive in a public place (ex. Theater or meeting): 3 As a passenger in a car for an hour without a break: 3 Lying down to rest in the afternoon: 3 Sitting and talking to someone: 3 Sitting quietly after lunch (no alcohol): 3 In a car, while stopped in traffic: 2 Total: 23     Objective:  Neurological Exam  Physical Exam Physical Examination:   Vitals:   07/28/17 1330  BP: (!) 179/113  Pulse: (!) 114    General Examination: The patient is a very pleasant 27 y.o. male in no acute distress. He appears well-developed and well-nourished and well groomed.   HEENT: Normocephalic, atraumatic, pupils are equal, round and reactive to light and accommodation. Extraocular tracking is good without limitation to gaze excursion or nystagmus noted. Normal smooth pursuit is noted. Hearing is grossly intact. Face is symmetric with normal facial animation and normal facial sensation. Speech is clear with no dysarthria noted. There is no hypophonia. There is no lip, neck/head, jaw or voice tremor. Neck is supple with full range of passive and active motion. There are no carotid bruits on auscultation. Oropharynx exam reveals: mild mouth dryness, adequate dental hygiene and moderate airway crowding, due to tonsils of 1-2+, thicker soft palate and larger uvula. Mallampati is class II. Tongue protrudes centrally and palate elevates symmetrically. Neck size is 20.25 inches.  Chest: Clear to auscultation without wheezing, rhonchi or crackles noted.  Heart: S1+S2+0, regular and normal without murmurs, rubs or gallops noted.   Abdomen: Soft, non-tender and non-distended with normal bowel sounds appreciated on auscultation.  Extremities: There is no  pitting edema in the distal lower extremities bilaterally.  Skin: Warm and dry without trophic changes noted.  Musculoskeletal: exam reveals no obvious joint deformities, tenderness or joint swelling or erythema, L ankle wider than R.   Neurologically:  Mental status: The patient is awake, alert and oriented in all 4 spheres. His immediate and remote memory, attention, language skills and fund of knowledge are appropriate. There is no evidence of aphasia, agnosia, apraxia or anomia. Speech is clear with normal prosody and enunciation. Thought process is linear. Mood is normal and affect is normal.  Cranial nerves II - XII are as described above under HEENT exam. In addition: shoulder shrug is normal with equal shoulder  height noted. Motor exam: Normal bulk, strength and tone is noted. There is no drift, tremor or rebound. Romberg is negative. Fine motor skills and coordination: intact with normal finger taps, normal hand movements, normal rapid alternating patting, normal foot taps and normal foot agility.  Cerebellar testing: No dysmetria or intention tremor on finger to nose testing. Heel to shin is unremarkable bilaterally. There is no truncal or gait ataxia.  Sensory exam: intact to light touch in the upper and lower extremities.  Gait, station and balance: He stands easily. No veering to one side is noted. No leaning to one side is noted. Posture is age-appropriate and stance is narrow based. Gait shows normal stride length and normal pace. No problems turning are noted. Tandem walk is unremarkable.  Assessment and Plan:    In summary, ARTRELL LAWLESS is a very pleasant 27 y.o.-year old male  with an underlying medical history of hypertension, asthma, knee pain, allergic rhinitis and morbid obesity with a BMI of over 45, whose history, FHx and physical exam are in keeping with obstructive sleep apnea (OSA). I had a long chat with the patient about my findings and the diagnosis of OSA, its  prognosis and treatment options. We talked about medical treatments, surgical interventions and non-pharmacological approaches. I explained in particular the risks and ramifications of untreated moderate to severe OSA, especially with respect to developing cardiovascular disease down the Road, including congestive heart failure, difficult to treat hypertension, cardiac arrhythmias, or stroke. Even type 2 diabetes has, in part, been linked to untreated OSA. Symptoms of untreated OSA include daytime sleepiness, memory problems, mood irritability and mood disorder such as depression and anxiety, lack of energy, as well as recurrent headaches, especially morning headaches. We talked about trying to maintain a healthy lifestyle in general, as well as the importance of weight control. I encouraged the patient to eat healthy, exercise daily and keep well hydrated, to keep a scheduled bedtime and wake time routine, to not skip any meals and eat healthy snacks in between meals. I advised the patient not to drive when feeling sleepy. I recommended the following at this time: sleep study with potential positive airway pressure titration. (We will score hypopneas at 3%). We will try to get him in for a daytime sleep study secondary to his work schedule.   I explained the sleep test procedure to the patient and also explained the CPAP treatment option to the patient, who indicated that he would be willing to try CPAP if the need arises. I explained the importance of being compliant with PAP treatment, not only for insurance purposes but primarily to improve His symptoms, and for the patient's long term health benefit, including to reduce His cardiovascular risks. I answered all his questions today and the patient was in agreement. I would like to see him back after the sleep study is completed and encouraged him to call with any interim questions, concerns, problems or updates.   Thank you very much for allowing me to  participate in the care of this nice patient. If I can be of any further assistance to you please do not hesitate to call me at [phone].  Sincerely,   Kyle Foley, MD, PhD

## 2017-09-18 ENCOUNTER — Encounter (HOSPITAL_COMMUNITY): Payer: Self-pay

## 2017-09-18 ENCOUNTER — Ambulatory Visit (HOSPITAL_COMMUNITY)
Admission: EM | Admit: 2017-09-18 | Discharge: 2017-09-18 | Disposition: A | Discharge status: Home / Self Care | Payer: BC Managed Care – PPO | Attending: Internal Medicine | Admitting: Internal Medicine

## 2017-09-18 DIAGNOSIS — R0789 Other chest pain: Secondary | ICD-10-CM | POA: Diagnosis not present

## 2017-09-18 DIAGNOSIS — J029 Acute pharyngitis, unspecified: Secondary | ICD-10-CM | POA: Insufficient documentation

## 2017-09-18 DIAGNOSIS — I10 Essential (primary) hypertension: Secondary | ICD-10-CM | POA: Insufficient documentation

## 2017-09-18 DIAGNOSIS — J45909 Unspecified asthma, uncomplicated: Secondary | ICD-10-CM | POA: Insufficient documentation

## 2017-09-18 DIAGNOSIS — M25561 Pain in right knee: Secondary | ICD-10-CM | POA: Insufficient documentation

## 2017-09-18 DIAGNOSIS — B349 Viral infection, unspecified: Secondary | ICD-10-CM | POA: Diagnosis not present

## 2017-09-18 DIAGNOSIS — R590 Localized enlarged lymph nodes: Secondary | ICD-10-CM | POA: Insufficient documentation

## 2017-09-18 DIAGNOSIS — M25562 Pain in left knee: Secondary | ICD-10-CM | POA: Diagnosis not present

## 2017-09-18 DIAGNOSIS — G8929 Other chronic pain: Secondary | ICD-10-CM | POA: Diagnosis not present

## 2017-09-18 DIAGNOSIS — R51 Headache: Secondary | ICD-10-CM | POA: Diagnosis present

## 2017-09-18 DIAGNOSIS — R079 Chest pain, unspecified: Secondary | ICD-10-CM | POA: Insufficient documentation

## 2017-09-18 LAB — POCT RAPID STREP A: STREPTOCOCCUS, GROUP A SCREEN (DIRECT): NEGATIVE

## 2017-09-18 MED ORDER — AMOXICILLIN 500 MG PO TABS: 500.0000 mg | ORAL_TABLET | Freq: Two times a day (BID) | ORAL | 0 refills | Status: AC

## 2017-09-18 NOTE — ED Triage Notes (Signed)
Pt presents with complaints of headache, sinus pressure and chest pain x 5 days. Pt also endorses feeling fatigue

## 2017-09-18 NOTE — ED Provider Notes (Signed)
[facility]    CSN: [id number] Arrival date & time: 09/18/17  1214     History   Chief Complaint Chief Complaint  Patient presents with  . Chest Pain  . Headache  . Facial Pain    HPI Kyle Doyle is a 27 y.o. male.   Subjective:   History was provided by the patient.  Kyle Doyle is a 27 y.o. male who presents for evaluation of a sore throat. Associated symptoms include dry cough, headache, itching in eyes, nasal blockage, clear nasal discharge and sinus pressure. Onset of symptoms was 4 days ago and has been unchanged since that time. Patient has not tried anything for his symptoms. He is drinking plenty of fluids. He has not had recent close exposure to someone with proven streptococcal pharyngitis.  The following portions of the patient's history were reviewed and updated as appropriate: allergies, current medications, past family history, past medical history, past social history, past surgical history and problem list.          Past Medical History:  Diagnosis Date  . Asthma   . Hypertension     Patient Active Problem List   Diagnosis Date Noted  . Elevated blood pressure reading 06/24/2017  . Morbid obesity (HCC) 06/24/2017  . Sleep-disordered breathing 06/24/2017  . Chronic pain of both knees 06/24/2017  . ALLERGIC RHINITIS 01/02/2009    Past Surgical History:  Procedure Laterality Date  . ORIF TIBIA & FIBULA FRACTURES    . SHOULDER ARTHROSCOPY         Home Medications    Prior to Admission medications   Not on File    Family History No family history on file.  Social History Social History   Tobacco Use  . Smoking status: Never Smoker  . Smokeless tobacco: Never Used  Substance Use Topics  . Alcohol use: Yes    Comment: occasional  . Drug use: No     Allergies   Peanut-containing drug products   Review of Systems Review of Systems  Constitutional: Negative for fever.  HENT: Positive for congestion, ear  pain, rhinorrhea, sinus pressure and sore throat.   Eyes: Positive for itching.  Respiratory: Positive for cough and chest tightness. Negative for shortness of breath and wheezing.   Cardiovascular: Negative for chest pain.  Gastrointestinal: Negative for nausea.  Neurological: Positive for headaches.     Physical Exam Triage Vital Signs ED Triage Vitals [09/18/17 1227]  Enc Vitals Group     BP 118/60     Pulse Rate (!) 113     Resp 18     Temp 98 F (36.7 C)     Temp src      SpO2 96 %     Weight      Height      Head Circumference      Peak Flow      Pain Score 4     Pain Loc      Pain Edu?      Excl. in GC?    No data found.  Updated Vital Signs BP 118/60   Pulse (!) 113   Temp 98 F (36.7 C)   Resp 18   SpO2 96%   Visual Acuity Right Eye Distance:   Left Eye Distance:   Bilateral Distance:    Right Eye Near:   Left Eye Near:    Bilateral Near:     Physical Exam  Constitutional: He is oriented to person, place,  and time. He appears well-developed and well-nourished.  Non-toxic appearance.  HENT:  Head: Normocephalic and atraumatic.  Right Ear: Hearing, external ear and ear canal normal. No drainage, swelling or tenderness. Tympanic membrane is erythematous.  Left Ear: Hearing, external ear and ear canal normal. No drainage, swelling or tenderness. Tympanic membrane is erythematous.  Mouth/Throat: Mucous membranes are normal. No uvula swelling. Posterior oropharyngeal erythema present. No oropharyngeal exudate or posterior oropharyngeal edema. Tonsils are 2+ on the right. Tonsils are 2+ on the left. No tonsillar exudate.  Eyes: Pupils are equal, round, and reactive to light. EOM are normal.  Neck: Normal range of motion. Neck supple.  Cardiovascular: Normal rate, regular rhythm, intact distal pulses and normal pulses.  Pulmonary/Chest: Effort normal and breath sounds normal.  Musculoskeletal: Normal range of motion.  Lymphadenopathy:    He has cervical  adenopathy.  Neurological: He is alert and oriented to person, place, and time.  Skin: Skin is warm and dry.  Psychiatric: He has a normal mood and affect.     UC Treatments / Results  Labs (all labs ordered are listed, but only abnormal results are displayed) Labs Reviewed  CULTURE, GROUP A STREP [facility])  POCT RAPID STREP A    EKG None  Radiology No results found.  Procedures Procedures (including critical care time)  Medications Ordered in UC Medications - No data to display  Initial Impression / Assessment and Plan / UC Course  I have reviewed the triage vital signs and the nursing notes.  Pertinent labs & imaging results that were available during my care of the patient were reviewed by me and considered in my medical decision making (see chart for details).    27 year old male with history of hypertension presenting with a four-day history of dry cough, headache, eye itchiness, nasal congestion/discharge, sinus pressure and sore throat. Rapid strep negative.    Plan:  - Patient placed on antibiotics prophylactically due to cervical lymphadenopathy and posterior oropharyngeal erythema. - Throat cultures pending  - Use of OTC analgesics recommended as well as salt water gargles. - Use of decongestant recommended. - Follow up as needed.  Evaluation has revealed no signs of a dangerous process. Patient aware of findings.  Discussed diagnosis, treatment plan, possible red flag symptoms to watch out for, and the need for close follow up. Patient understands verbal as well as written discharge instructions, is warned regarding their illness and prognosis, is able to repeat any risks, and is comfortable with plan and disposition. Has a clear mental status at this time, good insight into illness (after discussion and teaching) and has clear judgment to make decisions regarding their care.  Documentation was completed with the aid of voice recognition software. Transcription  may contain typographical errors.  Final Clinical Impressions(s) / UC Diagnoses   Final diagnoses:  Pharyngitis with viral syndrome   Discharge Instructions   None    ED Prescriptions    None     Controlled Substance Prescriptions [place] Controlled Substance Registry consulted? Not Applicable   Lurline Idol, [place] 09/18/17 [phone]

## 2017-09-20 LAB — CULTURE, GROUP A STREP (THRC)

## 2017-09-21 ENCOUNTER — Telehealth (HOSPITAL_COMMUNITY): Payer: Self-pay

## 2017-09-21 NOTE — Telephone Encounter (Signed)
Culture is positive for non group A Strep germ.  This is a finding of uncertain significance; not the typical 'strep throat' germ.  Pt complains of persistent symptoms. Attempted to reach patient to assess need for antibiotics based on symptoms. No answer at this time. Voicemail left.

## 2017-11-11 ENCOUNTER — Ambulatory Visit (INDEPENDENT_AMBULATORY_CARE_PROVIDER_SITE_OTHER): Payer: BC Managed Care – PPO | Admitting: Neurology

## 2017-11-11 DIAGNOSIS — G4733 Obstructive sleep apnea (adult) (pediatric): Secondary | ICD-10-CM

## 2017-11-11 DIAGNOSIS — Z820 Family history of epilepsy and other diseases of the nervous system: Secondary | ICD-10-CM

## 2017-11-11 DIAGNOSIS — Z6841 Body Mass Index (BMI) 40.0 and over, adult: Secondary | ICD-10-CM

## 2017-11-11 DIAGNOSIS — G4726 Circadian rhythm sleep disorder, shift work type: Secondary | ICD-10-CM

## 2017-11-11 DIAGNOSIS — R400 Somnolence: Secondary | ICD-10-CM

## 2017-11-11 DIAGNOSIS — R030 Elevated blood-pressure reading, without diagnosis of hypertension: Secondary | ICD-10-CM

## 2017-11-12 ENCOUNTER — Telehealth: Payer: Self-pay

## 2017-11-12 NOTE — Telephone Encounter (Signed)
Pt returned RN's call °

## 2017-11-12 NOTE — Procedures (Signed)
PATIENT'S NAME:  Kyle Doyle, Kyle Doyle DOB:      05-09-1990      MR#:    [id number]     DATE OF RECORDING: 11/11/2017 REFERRING M.D.:  Jacquiline Doe, MD Study Performed:  Split-Night Titration Study HISTORY: 27 year old man with a history of hypertension, asthma, knee pain, allergic rhinitis and morbid obesity, who was diagnosed with OSA several years ago, but no longer has a CPAP machine. The patient endorsed the Epworth Sleepiness Scale at 23 points. The patient's weight 370 pounds with a height of 74 (inches), resulting in a BMI of 47.5 kg/m2. The patient's neck circumference measured 20.2 inches.  CURRENT MEDICATIONS: None  PROCEDURE:  This is a multichannel digital polysomnogram utilizing the Somnostar 11.2 system.  Electrodes and sensors were applied and monitored per AASM Specifications.   EEG, EOG, Chin and Limb EMG, were sampled at 200 Hz.  ECG, Snore and Nasal Pressure, Thermal Airflow, Respiratory Effort, CPAP Flow and Pressure, Oximetry was sampled at 50 Hz. Digital video and audio were recorded.      BASELINE STUDY WITHOUT CPAP RESULTS:  The study was conducted during the day to accommodate the patient's night shift schedule. Lights Out was at 09:02 and Lights On at 15:57 for the night, split study start at 10:03. Total recording time (TRT) was 70 min, with a total sleep time (TST) of 66 minutes.   The patient's sleep latency was 2.5 minutes. REM sleep was absent before the split. The sleep efficiency was 94.3 %.    SLEEP ARCHITECTURE: WASO (Wake after sleep onset) was 1 minutes, Stage N1 was 3 minutes, Stage N2 was 26.5 minutes, Stage N3 was 36.5 minutes and Stage R (REM sleep) was 0 minutes.  The percentages were Stage N1 4.5%, Stage N2 40.2%, Stage N3 55.3% and Stage R (REM sleep) 0%. The arousals were noted as: 4 were spontaneous, 0 were associated with PLMs, 134 were associated with respiratory events.  RESPIRATORY ANALYSIS:  There were a total of 170 respiratory events:  23 obstructive  apneas, 0 central apneas and 0 mixed apneas with a total of 23 apneas and an apnea index (AI) of 20.9. There were 147 hypopneas with a hypopnea index of 133.6. The patient also had 0 respiratory event related arousals (RERAs).  Snoring was noted.     The total APNEA/HYPOPNEA INDEX (AHI) was 154.5/hour and the total RESPIRATORY DISTURBANCE INDEX was 154.5 /hour.  0 events occurred in REM sleep and 294 events in NREM. The REM AHI was n/a, /hour versus a non-REM AHI of 154.5 /hour. The patient spent 310 minutes sleep time in the supine position 95 minutes in non-supine. The supine AHI was 100.0 /hour versus a non-supine AHI of 166.6 /hour.  OXYGEN SATURATION & C02:  The wake baseline 02 saturation was 92%, with the lowest being 68%. Time spent below 89% saturation equaled 65 minutes.  PERIODIC LIMB MOVEMENTS: The patient had a total of 0 Periodic Limb Movements.  The Periodic Limb Movement (PLM) index was 0 /hour and the PLM Arousal index was 0 /hour. Audio and video analysis did not show any abnormal or unusual movements, behaviors, phonations or vocalizations. The patient took no bathroom breaks after sleep onset. Moderate to loud snoring was noted. The EKG was in keeping with normal sinus rhythm (NSR)  TITRATION STUDY WITH CPAP RESULTS:   The patient was fitted with a large Simplus FFM. CPAP was initiated at 6 cmH20 with heated humidity per AASM split night standards and pressure was quickly advanced  to 8 cm, per patient request. CPAP was further gradually advanced to 18 cm, with AHI of 15.7/hour and O2 nadir of 87%. He was switched to standard BiPAP due to high pressure requirement. BiPAP was titrated from 18/14 cm to 20/16 cm H20 because of hypopneas, apneas and desaturations. At a BiPAP pressure of 20/16 cmH20, there was a reduction of the AHI 0/hour with supine REM sleep achieved and brief O2 nadir of 82%.   Total recording time (TRT) was 345 minutes, with a total sleep time (TST) of 338.5 minutes.  The patient's sleep latency was 13.5 minutes. REM latency was 33.5 minutes.  The sleep efficiency was 98.1 %.    SLEEP ARCHITECTURE: Wake after sleep was 3.5 minutes, Stage N1 10 minutes, Stage N2 123.5 minutes, Stage N3 95.5 minutes and Stage R (REM sleep) 109.5 minutes. The percentages were: Stage N1 3.%, Stage N2 36.5%, Stage N3 28.2% and Stage R (REM sleep) 32.3%, which is increased and in keeping with rebound. The arousals were noted as: 13 were spontaneous, 0 were associated with PLMs, 36 were associated with respiratory events.  RESPIRATORY ANALYSIS:  There were a total of 83 respiratory events: 7 obstructive apneas, 0 central apneas and 0 mixed apneas with a total of 7 apneas and an apnea index (AI) of 1.2. There were 76 hypopneas with a hypopnea index of 13.5 /hour. The patient also had 0 respiratory event related arousals (RERAs).      The total APNEA/HYPOPNEA INDEX  (AHI) was 14.7 /hour and the total RESPIRATORY DISTURBANCE INDEX was 14.7 /hour.  28 events occurred in REM sleep and 55 events in NREM. The REM AHI was 15.3 /hour versus a non-REM AHI of 14.4 /hour. REM sleep was achieved on a pressure of  cm/h2o (AHI was  .) The patient spent 88% of total sleep time in the supine position. The supine AHI was 14.7 /hour, versus a non-supine AHI of 14.8/hour.  OXYGEN SATURATION & C02:  The wake baseline 02 saturation was 95%, with the lowest being 54%. Time spent below 89% saturation equaled 82 minutes.  PERIODIC LIMB MOVEMENTS: The patient had a total of 0 Periodic Limb Movements. The Periodic Limb Movement (PLM) index was 0 /hour and the PLM Arousal index was 0 /hour.  Post-study, the patient indicated that sleep was the same as usual.  POLYSOMNOGRAPHY IMPRESSION:   1. Obstructive Sleep Apnea (OSA), severe    RECOMMENDATIONS:  1. This patient has very severe obstructive sleep apnea and responded well on standard BiPAP therapy (due to high pressure requirement). I will, therefore, start  the patient on home BiPAP treatment at a pressure of 20/16 cm via large FFM, with heated humidity. The patient should be reminded to be fully compliant with PAP therapy to improve sleep related symptoms and decrease long term cardiovascular risks. Please note that untreated obstructive sleep apnea may carry additional perioperative morbidity. Patients with significant obstructive sleep apnea (typically, in the moderate to severe degree) should receive, if possible, perioperative PAP (positive airway pressure) therapy and the surgeons and particularly the anesthesiologists should be informed of the diagnosis and the severity of the sleep disordered breathing. If feasible, the patient may be asked to bring his/her CPAP or BiPAP machine for a planned/elective surgery.  2. The final BiPAP pressure of 20/16 cm resulted in average O2 saturations in the low 90s, nadir of 82%, albeit briefly. The patient should be advised to pursue optimal asthma control, and aggressive weight loss. Avoiding the supine sleep position may  help maintain oxygen saturations in the 90s, while on BiPAP. 3. The patient should be cautioned not to drive, work at heights, or operate dangerous or heavy equipment when tired or sleepy. Review and reiteration of good sleep hygiene measures should be pursued with any patient. 4. The patient will be seen in follow-up in the sleep clinic at [facility] for discussion of the test results, symptom and treatment compliance review, further management strategies, etc. The referring provider will be notified of the test results.  I certify that I have reviewed the entire raw data recording prior to the issuance of this report in accordance with the Standards of Accreditation of the American Academy of Sleep Medicine (AASM)    Huston Foley, MD, PhD Diplomat, American Board of Neurology and Sleep Medicine (Neurology and Sleep Medicine)

## 2017-11-12 NOTE — Telephone Encounter (Signed)
I called pt. I advised pt that Dr. Frances FurbishAthar reviewed their sleep study results and found that pt has very severe osa but did well with the bipap during his study. Dr. Frances FurbishAthar recommends that pt start a bipap at home. I reviewed PAP compliance expectations with the pt. Pt is agreeable to starting a BiPAP. I advised pt that an order will be sent to a DME, [facility], and [facility] will call the pt within about one week after they file with the pt's insurance. [facility] will show the pt how to use the machine, fit for masks, and troubleshoot the BiPAP if needed. A follow up appt was made for insurance purposes with Dr. Frances FurbishAthar on 02/18/18 at 9:30am. Pt verbalized understanding to arrive 15 minutes early and bring their BiPAP. A letter with all of this information in it will be sent ot the pt's mychart account as a reminder. I verified with the pt that the address we have on file is correct. Pt verbalized understanding of results. Pt had no questions at this time but was encouraged to call back if questions arise.

## 2017-11-12 NOTE — Progress Notes (Signed)
Patient referred by Dr. Jimmey RalphParker, seen by me on 07/28/17, split night sleep study on 11/11/17. Please call and notify patient that the recent sleep study confirmed the diagnosis of very severe OSA. He did well with BiPAP during the study with significant improvement of the respiratory events. Therefore, I would like start the patient on BiPAP therapy at home by prescribing a machine for home use. I placed the order in the chart.  Please advise patient that we need a follow up appointment with either myself or one of our nurse practitioners in about 10 weeks post set-up to check for how the patient is feeling and how well the patient is using the machine, etc. Please go ahead and schedule the appointment, while you have the patient on the phone and make sure patient understands the importance of keeping this window for the FU appointment, as it is often an insurance requirement. Failing to adhere to this may result in losing coverage for sleep apnea treatment, at which point most patients are left with a choice of returning the machine or paying out of pocket (and we want neither of this to happen!).  Please re-enforce the importance of compliance with treatment and the need for us to monitor compliance data - again an insurance requirement and usually a good feedback for the patient as far as how they are doing.  Also remind patient, that any PAP machine or mask issues should be first addressed with the DME company, who provided the machine/mask.  Please ask if patient has a preference regarding DME company, may depend on the insurance too.  Please arrange for CPAP set up at home through a DME company of patient's choice.  Once you have spoken to the patient you can close the phone encounter. Please fax/route report to referring provider, thanks,   Huston FoleySaima Barbaraann Avans, MD, PhD [facility])

## 2017-11-12 NOTE — Addendum Note (Signed)
Addended by: Huston FoleyATHAR, Kimblery Diop on: 11/12/2017 08:07 AM   Modules accepted: Orders

## 2017-11-12 NOTE — Telephone Encounter (Signed)
-----   Message from Huston FoleySaima Athar, MD sent at 11/12/2017  8:05 AM EDT ----- Patient referred by Dr. Jimmey RalphParker, seen by me on 07/28/17, split night sleep study on 11/11/17. Please call and notify patient that the recent sleep study confirmed the diagnosis of very severe OSA. He did well with BiPAP during the study with significant improvement of the respiratory events. Therefore, I would like start the patient on BiPAP therapy at home by prescribing a machine for home use. I placed the order in the chart.  Please advise patient that we need a follow up appointment with either myself or one of our nurse practitioners in about 10 weeks post set-up to check for how the patient is feeling and how well the patient is using the machine, etc. Please go ahead and schedule the appointment, while you have the patient on the phone and make sure patient understands the importance of keeping this window for the FU appointment, as it is often an insurance requirement. Failing to adhere to this may result in losing coverage for sleep apnea treatment, at which point most patients are left with a choice of returning the machine or paying out of pocket (and we want neither of this to happen!).  Please re-enforce the importance of compliance with treatment and the need for us to monitor compliance data - again an insurance requirement and usually a good feedback for the patient as far as how they are doing.  Also remind patient, that any PAP machine or mask issues should be first addressed with the DME company, who provided the machine/mask.  Please ask if patient has a preference regarding DME company, may depend on the insurance too.  Please arrange for CPAP set up at home through a DME company of patient's choice.  Once you have spoken to the patient you can close the phone encounter. Please fax/route report to referring provider, thanks,   Huston FoleySaima Athar, MD, PhD [facility])

## 2017-11-12 NOTE — Telephone Encounter (Signed)
I called pt to discuss his sleep study results. No answer, left a message asking him to call me back. 

## 2017-11-12 NOTE — Telephone Encounter (Signed)
I called pt again. No answer, left a message asking him to call me back. 

## 2018-01-11 ENCOUNTER — Ambulatory Visit (HOSPITAL_COMMUNITY): Payer: Self-pay | Admitting: Psychiatry

## 2018-02-18 ENCOUNTER — Ambulatory Visit: Payer: Self-pay | Admitting: Neurology

## 2018-02-28 ENCOUNTER — Encounter: Payer: Self-pay | Admitting: Neurology

## 2018-03-02 ENCOUNTER — Encounter: Payer: Self-pay | Admitting: Neurology

## 2018-03-02 ENCOUNTER — Ambulatory Visit: Payer: BC Managed Care – PPO | Admitting: Neurology

## 2018-03-02 VITALS — BP 151/98 | HR 85 | Ht 74.0 in | Wt 370.0 lb

## 2018-03-02 DIAGNOSIS — Z789 Other specified health status: Secondary | ICD-10-CM | POA: Diagnosis not present

## 2018-03-02 DIAGNOSIS — Z6841 Body Mass Index (BMI) 40.0 and over, adult: Secondary | ICD-10-CM

## 2018-03-02 DIAGNOSIS — G4733 Obstructive sleep apnea (adult) (pediatric): Secondary | ICD-10-CM | POA: Diagnosis not present

## 2018-03-02 NOTE — Progress Notes (Signed)
Order for bipap pressure change and supplies sent to [facility] via community message. Confirmation received that the order transmitted was successful.

## 2018-03-02 NOTE — Progress Notes (Signed)
Subjective:    Patient ID: Kyle Doyle is a 27 y.o. male.  HPI     Interim history:  Kyle Doyle is a 27 year old right-handed gentleman with an underlying medical history of hypertension, asthma, knee pain, allergic rhinitis and morbid obesity with a BMI of over 61, who presents for follow-up consultation of his obstructive sleep apnea, after recent sleep study testing and starting BiPAP therapy at home. The patient is unaccompanied today. I first met him on 07/28/2017, at which time he reported a prior diagnosis of sleep apnea and CPAP therapy in the past but he no longer had a CPAP machine. He was advised to proceed with a sleep study. He had a split-night sleep study on 11/11/2017. I went over his test results with him in detail today. Baseline sleep efficiency was 94.3%, sleep latency 2.5 minutes and REM sleep was absent. Total AHI was rather high at 154.5 per hour. He had an average oxygen saturation of 92%, nadir was in the severe range at 68%. He had no significant PLMS. He was started on CPAP and titrated from 6 cm to 18 cm. He still had residual sleep disordered breathing with a residual AHI of 15.7 on a pressure of 18 cm, O2 nadir of 87%. He was therefore switched to BiPAP secondary to high pressure requirement. He was further titrated to 20/16 cm. On the final pressure his AHI was 0 per hour with supine REM sleep achieved and O2 nadir very briefly at 82%. Based on his test results I prescribed BiPAP therapy for home use a pressure of 20/16 cm.  Today, 03/02/2018: I reviewed his BiPAP compliance data from the past 30 days from 01/30/2018 through 02/28/2018, during which time he used his BiPAP 24 days with percent used days greater than 4 hours at 37%, indicating suboptimal compliance. In the past 97 days he used his machine 73 days with percent used days greater than 4 hours at 33% only, average usage of 3 hours and 39 minutes, residual AHI at goal at 3.3 per hour, leak acceptable with the 95th  percentile at 10 L/m on a pressure of 20/16. He reports that he has some trouble tolerating the pressure. He uses a fullface mask. He would like to get new supplies and also requests a pressure reduction. He is motivated to continue with treatment. He admits that he has skipped some nights and also does not always put the mask on when he naps. 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 He tries to put it on around 8 AM and generally tries to sleep till 2 PM. He also takes some extended naps but has not been using his BiPAP during naps. He would be willing to increase his usage. He inquired about other treatment options including surgical options but declines an ENT referral at this time.  The patient's allergies, current medications, family history, past medical history, past social history, past surgical history and problem list were reviewed and updated as appropriate.   Previously (copied from previous notes for reference):   07/28/2017: (He) reports snoring and excessive daytime somnolence. I reviewed your office note from 06/24/2017. He had a sleep study some 4 or 5 years ago but no longer has a CPAP machine. He used CPAP while in college. Prior sleep study results are not available for my review today. His Epworth sleepiness score is 23 out of 24 today, fatigue score is 51 out of 63. He is single and lives alone. He did not smoke cigarettes but uses  tobacco occasionally, drinks alcohol in the form of beer, one per week on average and caffeine in the form of coffee 3-4 cups per day, typically while at work. Of note, he works night shift, 4:30 PM to 6 AM. He works 2 days on and 5 days off 1 week, then 2 days off in 5 days on the next week. He has a strong family history of obstructive sleep apnea including his father and his paternal uncle. Patient's bedtime is between 7:30 and 8 AM typically. He does not often sleepy on 1 PM. He does not wake up rested. He has gained weight in the past few years.   His Past Medical History  Is Significant For: Past Medical History:  Diagnosis Date  . Asthma   . Hypertension     His Past Surgical History Is Significant For: Past Surgical History:  Procedure Laterality Date  . ORIF TIBIA & FIBULA FRACTURES    . SHOULDER ARTHROSCOPY      His Family History Is Significant For: No family history on file.  His Social History Is Significant For: Social History   Socioeconomic History  . Marital status: Single    Spouse name: Not on file  . Number of children: Not on file  . Years of education: Not on file  . Highest education level: Not on file  Occupational History  . Not on file  Social Needs  . Financial resource strain: Not on file  . Food insecurity:    Worry: Not on file    Inability: Not on file  . Transportation needs:    Medical: Not on file    Non-medical: Not on file  Tobacco Use  . Smoking status: Never Smoker  . Smokeless tobacco: Never Used  Substance and Sexual Activity  . Alcohol use: Yes    Comment: occasional  . Drug use: No  . Sexual activity: Not on file  Lifestyle  . Physical activity:    Days per week: Not on file    Minutes per session: Not on file  . Stress: Not on file  Relationships  . Social connections:    Talks on phone: Not on file    Gets together: Not on file    Attends religious service: Not on file    Active member of club or organization: Not on file    Attends meetings of clubs or organizations: Not on file    Relationship status: Not on file  Other Topics Concern  . Not on file  Social History Narrative  . Not on file    His Allergies Are:  Allergies  Allergen Reactions  . Peanut-Containing Drug Products Shortness Of Breath and Swelling    TREE NUTS ONLY-Almonds, Pecans, and hazelnuts  :   His Current Medications Are:  No outpatient encounter medications on file as of 03/02/2018.   No facility-administered encounter medications on file as of 03/02/2018.   :  Review of Systems:  Out of a complete 14  point review of systems, all are reviewed and negative with the exception of these symptoms as listed below: Review of Systems  Neurological:       Pt presents today to discuss his bipap. Pt reports that his bipap is going well.    Objective:  Neurological Exam  Physical Exam Physical Examination:   Vitals:   03/02/18 0753  BP: (!) 151/98  Pulse: 85    General Examination: The patient is a very pleasant 27 y.o. male in no acute  distress. He appears well-developed and well-nourished and well groomed.   HEENT: Normocephalic, atraumatic, pupils are equal, round and reactive to light and accommodation. Extraocular tracking is good without limitation to gaze excursion or nystagmus noted. Normal smooth pursuit is noted. Hearing is grossly intact. Face is symmetric with normal facial animation and normal facial sensation. Speech is clear with no dysarthria noted. There is no hypophonia. There is no lip, neck/head, jaw or voice tremor. Neck shows FROM. Oropharynx exam reveals: mild mouth dryness, adequate dental hygiene and moderate/marked airway crowdind. Tongue protrudes centrally and palate elevates symmetrically.  Chest: Clear to auscultation without wheezing, rhonchi or crackles noted.  Heart: S1+S2+0, regular and normal without murmurs, rubs or gallops noted.   Abdomen: Soft, non-tender and non-distended with normal bowel sounds appreciated on auscultation.  Extremities: There is no pitting edema in the distal lower extremities bilaterally.  Skin: Warm and dry without trophic changes noted.  Musculoskeletal: exam reveals no obvious joint deformities, tenderness or joint swelling or erythema, L ankle wider than R, stable.   Neurologically:  Mental status: The patient is awake, alert and oriented in all 4 spheres. His immediate and remote memory, attention, language skills and fund of knowledge are appropriate. There is no evidence of aphasia, agnosia, apraxia or anomia. Speech  is clear with normal prosody and enunciation. Thought process is linear. Mood is normal and affect is normal.  Cranial nerves II - XII are as described above under HEENT exam.  Motor exam: Normal bulk, strength and tone is noted. There is no tremor. Fine motor skills and coordination: grossly intact.  Cerebellar testing: No dysmetria or intention tremor. There is no truncal or gait ataxia.  Sensory exam: intact to light touch in the upper and lower extremities.  Gait, station and balance: He stands easily. No veering to one side is noted. No leaning to one side is noted. Posture is age-appropriate and stance is narrow based. Gait shows normal stride length and normal pace. No problems turning are noted.  Assessment and Plan:    In summary, Kyle Doyle is a very pleasant 27 year old male  with an underlying medical history of hypertension, asthma, knee pain, allergic rhinitis and morbid obesity with a BMI of over 32, who presents for FU consultation of his severe OSA. He is on BiPAP with good apnea control. While he puts on his BiPAP most nights or days (due to shift work), he does not keep it on long enough (compliance for 4 hours or more is suboptimal). However, he also reports sleep disruption and difficulty tolerating the pressure. He is motivated to continue and commended for his trying but strongly encouraged to be consistent with BiPAP therapy and also work aggressively on weight loss. We talked about his sleep study results in detail. We also reviewed his compliance data and I also reviewed his compliance data based on his data chip in the machine and compliance was the same compared to the wireless compliance data. He is advised to use his BiPAP with all naps as well. He is advised to get in touch with his DME company regarding supplies. I will reduce the pressure by one notch to 19/15 cm with better tolerance. This may result in a slight increase in the residual sleep disordered breathing but  it is worth trying, especially if it results in better compliance overall.  I again explained in particular the risks and ramifications of untreated moderate to severe OSA, especially with respect to developing cardiovascular disease down  the Road, including congestive heart failure, difficult to treat hypertension, cardiac arrhythmias, or stroke.  I again explained the importance of being compliant with PAP treatment, not only for insurance purposes but primarily to improve His symptoms, and for the patient's long term health benefit, including to reduce His cardiovascular risks.he is advised to follow-up in 3 months, sooner if needed. I answered all his questions today and he was in agreement. I spent 30 minutes in total face-to-face time with the patient, more than 50% of which was spent in counseling and coordination of care, reviewing test results, reviewing medication and discussing or reviewing the diagnosis of OSA, its prognosis and treatment options. Pertinent laboratory and imaging test results that were available during this visit with the patient were reviewed by me and considered in my medical decision making (see chart for details).

## 2018-03-02 NOTE — Patient Instructions (Addendum)
Please continue using your BiPAP regularly. While your insurance may require that you use BiPAP at least 4 hours each night on 70% of the nights, I recommend, that you not skip any nights and use it throughout the night if you can. Getting used to BiPAP and staying with the treatment long term does take time and patience and discipline. Untreated obstructive sleep apnea when it is moderate to severe can have an adverse impact on cardiovascular health and raise her risk for heart disease, arrhythmias, hypertension, congestive heart failure, stroke and diabetes. Untreated obstructive sleep apnea causes sleep disruption, nonrestorative sleep, and sleep deprivation. This can have an impact on your day to day functioning and cause daytime sleepiness and impairment of cognitive function, memory loss, mood disturbance, and problems focussing. Using BiPAP regularly can improve these symptoms. Please try to be consistent with keeping the BiPAP on all night.  I will ask for [facility] to reduce your pressure for better tolerance.  Please FU in 3 month with nurse practitioner.  Try to lose weight.  The address for [facility] is: [address] [phone].

## 2018-03-05 ENCOUNTER — Encounter: Payer: Self-pay | Admitting: Family Medicine

## 2018-03-05 ENCOUNTER — Ambulatory Visit: Payer: BC Managed Care – PPO | Admitting: Family Medicine

## 2018-03-05 VITALS — BP 132/78 | HR 86 | Temp 98.6°F | Ht 74.0 in | Wt 371.2 lb

## 2018-03-05 DIAGNOSIS — J029 Acute pharyngitis, unspecified: Secondary | ICD-10-CM | POA: Diagnosis not present

## 2018-03-05 DIAGNOSIS — R739 Hyperglycemia, unspecified: Secondary | ICD-10-CM

## 2018-03-05 DIAGNOSIS — R52 Pain, unspecified: Secondary | ICD-10-CM

## 2018-03-05 DIAGNOSIS — R350 Frequency of micturition: Secondary | ICD-10-CM | POA: Diagnosis not present

## 2018-03-05 DIAGNOSIS — R059 Cough, unspecified: Secondary | ICD-10-CM

## 2018-03-05 DIAGNOSIS — L918 Other hypertrophic disorders of the skin: Secondary | ICD-10-CM

## 2018-03-05 DIAGNOSIS — R05 Cough: Secondary | ICD-10-CM

## 2018-03-05 DIAGNOSIS — L700 Acne vulgaris: Secondary | ICD-10-CM

## 2018-03-05 LAB — POCT URINALYSIS DIPSTICK
Bilirubin, UA: NEGATIVE
Glucose, UA: POSITIVE — AB
LEUKOCYTES UA: NEGATIVE
NITRITE UA: NEGATIVE
PH UA: 6 (ref 5.0–8.0)
Protein, UA: NEGATIVE
RBC UA: NEGATIVE
SPEC GRAV UA: 1.015 (ref 1.010–1.025)
UROBILINOGEN UA: 0.2 U/dL

## 2018-03-05 LAB — POC INFLUENZA A&B (BINAX/QUICKVUE)
Influenza A, POC: NEGATIVE
Influenza B, POC: NEGATIVE

## 2018-03-05 LAB — POCT GLYCOSYLATED HEMOGLOBIN (HGB A1C): HEMOGLOBIN A1C: 11.4 % — AB (ref 4.0–5.6)

## 2018-03-05 LAB — POCT RAPID STREP A (OFFICE): Rapid Strep A Screen: NEGATIVE

## 2018-03-05 MED ORDER — AZITHROMYCIN 250 MG PO TABS: ORAL_TABLET | ORAL | 0 refills | Status: DC

## 2018-03-05 MED ORDER — IPRATROPIUM BROMIDE 0.06 % NA SOLN: 2.0000 | Freq: Four times a day (QID) | NASAL | 0 refills | Status: DC

## 2018-03-05 MED ORDER — BENZONATATE 200 MG PO CAPS: 200.0000 mg | ORAL_CAPSULE | Freq: Two times a day (BID) | ORAL | 0 refills | Status: DC | PRN

## 2018-03-05 MED ORDER — CLINDAMYCIN PHOSPHATE 1 % EX GEL: Freq: Two times a day (BID) | CUTANEOUS | 0 refills | Status: DC

## 2018-03-05 NOTE — Assessment & Plan Note (Signed)
Start Clindagel.

## 2018-03-05 NOTE — Assessment & Plan Note (Addendum)
A1c elevated to 11.4 today.  Had a lengthy discussion with patient regarding results.  Recommended starting medication for hyperglycemia and diabetes.  Patient declined.  He wishes to work on diet and exercise and recheck in 3 months.  Offered nutrition referral, however patient declined.  Discussed goal behavioral modifications including importance of weight loss, low glycemic index diet, and cardio workouts.   He will follow-up with me in 3 months.  Will likely need to start metformin at that time pending A1c results.

## 2018-03-05 NOTE — Progress Notes (Addendum)
Subjective:  Kyle Doyle is a 27 y.o. male who presents today for same-day appointment with a chief complaint of cough.   HPI:  Cough, Acute problem Started about a week ago. Associated with sore throat, subjective fevers, chills, malaise, sinus congestion, and ear pressure. 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 Cough is productive of clear sputum.  Has tried taking Mucinex with modest improvement in symptoms.  No other obvious alleviating or aggravating factors.  Urinary Frequency Started a couple of months ago. Associated with increased thirst.  He is concerned he may have diabetes.  Rash Started about a year ago.  Located on the back of his neck and upper back.  Has several areas of pus to the area.  Working may present and concerned he may have scabies.  Irritated skin tag Present for several years.  Located on mid back.  Has become more irritated recently.  ROS: Per HPI  PMH: He reports that he has never smoked. He has never used smokeless tobacco. He reports that he drinks alcohol. He reports that he does not use drugs.  Objective:  Physical Exam: BP 132/78 (BP Location: Left Arm, Patient Position: Sitting, Cuff Size: Large)   Pulse 86   Temp 98.6 F (37 C) (Oral)   Ht 6\' 2"  (1.88 m)   Wt (!) 371 lb 3.2 oz (168.4 kg)   SpO2 96%   BMI 47.66 kg/m   Wt Readings from Last 3 Encounters:  03/05/18 (!) 371 lb 3.2 oz (168.4 kg)  03/02/18 (!) 370 lb (167.8 kg)  07/28/17 (!) 370 lb (167.8 kg)  Gen: NAD, resting comfortably HEENT: TMs with clear effusion bilaterally.  Oropharynx erythematous without exudate. CV: RRR with no murmurs appreciated Pulm: NWOB, CTAB with no crackles, wheezes, or rhonchi MSK: No edema, cyanosis, or clubbing noted Skin: Several pustules noted on posterior neck and upper back.  Approximately 1 cm pedunculated skin tag on midline thoracic back. Neuro: Grossly normal, moves all extremities Psych: Normal affect and thought content  Results for  orders placed or performed in visit on 03/05/18 (from the past 24 hour(s))  POCT glycosylated hemoglobin (Hb A1C)     Status: Abnormal   Collection Time: 03/05/18  9:27 AM  Result Value Ref Range   Hemoglobin A1C 11.4 (A) 4.0 - 5.6 %   HbA1c POC (<> result, manual entry)     HbA1c, POC (prediabetic range)     HbA1c, POC (controlled diabetic range)    POCT urinalysis dipstick     Status: Abnormal   Collection Time: 03/05/18  9:28 AM  Result Value Ref Range   Color, UA Yellow    Clarity, UA Clear    Glucose, UA Positive (A) Negative   Bilirubin, UA Negative    Ketones, UA Trace    Spec Grav, UA 1.015 1.010 - 1.025   Blood, UA Negative    pH, UA 6.0 5.0 - 8.0   Protein, UA Negative Negative   Urobilinogen, UA 0.2 0.2 or 1.0 E.U./dL   Nitrite, UA Negative    Leukocytes, UA Negative Negative   Appearance     Odor       Skin Tag Removal Procedure Note  Pre-operative Diagnosis: Irritated Classic skin tags (acrochordon)  Post-operative Diagnosis: Irritated Classic skin tags (acrochordon)  Locations: Midline thoracic back  Indications: Therapeutic  Anesthesia: Lidocaine 1% with epinephrine without added sodium bicarbonate  Procedure Details  The risks (including bleeding and infection) and benefits of the procedure and Verbal  informed consent obtained. Using sterile iris scissors, multiple skin tags were snipped off at their bases after cleansing with Betadine.  Bleeding was controlled by pressure.   Findings: Pathognomonic benign lesions  not sent for pathological exam.  Condition: Stable  Complications: none.  Plan: 1. Instructed to keep the wounds dry and covered for 24-48h and clean thereafter. 2. Warning signs of infection were reviewed.   3. Recommended that the patient use OTC analgesics as needed for pain.  4. Return as needed.   Assessment/Plan:  Cough Given the symptoms have been persistent for the last week and are worsening, will start azithromycin.   Also start Atrovent nasal spray and Tessalon.  Encouraged good oral hydration.  Recommended Tylenol/Motrin as needed.  Discussed reasons to return to care.  Follow-up as needed.  Skin tag Removed today.  Tolerated well.  Please see above procedure note.  Urinary Frequency Likely secondary to osmotic diuresis secondary to hyperglycemia.  UA otherwise normal.  Hyperglycemia A1c elevated to 11.4 today.  Had a lengthy discussion with patient regarding results.  Recommended starting medication for hyperglycemia and diabetes.  Patient declined.  He wishes to work on diet and exercise and recheck in 3 months.  Offered nutrition referral, however patient declined.  Discussed goal behavioral modifications including importance of weight loss, low glycemic index diet, and cardio workouts.   He will follow-up with me in 3 months.  Will likely need to start metformin at that time pending A1c results.  Acne vulgaris Start Clindagel.  Kyle Doyle. Kyle Ralph, MD 03/05/2018 9:49 AM

## 2018-03-05 NOTE — Addendum Note (Signed)
Addended by: Koleen Distance on: 03/05/2018 10:37 AM   Modules accepted: Orders

## 2018-03-05 NOTE — Patient Instructions (Addendum)
Start the atrovent and azithromycin.   Please stay well hydrated.  You can take tylenol and/or motrin as needed for low grade fever and pain.  Please let me know if your symptoms worsen or fail to improve.  We took off your skin tag today. Please leave the bandage in place for the next 24-48 hours. Let me know if you have any severe pain, bleeding, or drainage from the area.   Please start the clindagel for the rash on your neck and back.   Your blood sugar is high and in the diabetic range.   Please let me know if you change your mind about metformin. This medication will help lower your sugars, help with weight loss, and decrease your lifetime risk for having a heart attack or stroke.   Please come back in 3 months so that we can recheck your blood sugar.  Take care, Dr [name]

## 2018-04-09 ENCOUNTER — Encounter: Payer: Self-pay | Admitting: *Deleted

## 2018-04-09 ENCOUNTER — Encounter: Payer: Self-pay | Admitting: Physician Assistant

## 2018-04-09 ENCOUNTER — Ambulatory Visit: Payer: BC Managed Care – PPO | Admitting: Physician Assistant

## 2018-04-09 VITALS — BP 122/88 | HR 95 | Temp 98.3°F | Ht 74.0 in | Wt 366.8 lb

## 2018-04-09 DIAGNOSIS — E1140 Type 2 diabetes mellitus with diabetic neuropathy, unspecified: Secondary | ICD-10-CM

## 2018-04-09 DIAGNOSIS — J069 Acute upper respiratory infection, unspecified: Secondary | ICD-10-CM | POA: Diagnosis not present

## 2018-04-09 MED ORDER — AZITHROMYCIN 250 MG PO TABS: ORAL_TABLET | ORAL | 0 refills | Status: DC

## 2018-04-09 NOTE — Patient Instructions (Addendum)
It was great to see you!  Use medication as prescribed: Azithromycin  Push fluids and get plenty of rest. Please return if you are not improving as expected, or if you have high fevers (>101.5) or difficulty swallowing or worsening productive cough.  Call clinic with questions.  I hope you start feeling better soon!  PLEASE SEE Dr Jimmey RalphParker NEXT WEEK to discuss your blood sugars and foot numbness!! This is VERY important. We care about you and your health!

## 2018-04-09 NOTE — Progress Notes (Signed)
Kyle Doyle is a 27 y.o. male here for a new problem.  History of Present Illness:   Chief Complaint  Patient presents with  . Cough  . Generalized Body Aches  . Sore Throat    HPI  URI Patient reports 1 week hx of URI symptoms -- cough, subjective fever, sore throat, body aches, chills. Denies: diarrhea, nausea, CP, SOB, neck pain or stiffness. Has tried OTC cough medications and aleve without relief.  Redness and numbness in L foot Patient reports that he has recently noticed an increase in redness on the inner part of his left foot. Denies prior injury. States that the bottom of his feet feel numb to him. Denies severe pain or discharge from any open lesions on bilateral feet.  Of note, last office visit on 03/05/18 revealed A1c of: Lab Results  Component Value Date   HGBA1C 11.4 (A) 03/05/2018  He declined medication at that time and states that he was going to work on diet and exercise and f/u with PCP in 3 months for re-evaluation. Continues to have polydipsia and polyuria. He has lost about 5 lb since last here. He states that he intentionally trying to lose weight.  Wt Readings from Last 5 Encounters:  04/09/18 (!) 366 lb 12.8 oz (166.4 kg)  03/05/18 (!) 371 lb 3.2 oz (168.4 kg)  03/02/18 (!) 370 lb (167.8 kg)  07/28/17 (!) 370 lb (167.8 kg)  07/08/17 (!) 375 lb 9.6 oz (170.4 kg)     Past Medical History:  Diagnosis Date  . Asthma   . Hypertension      Social History   Socioeconomic History  . Marital status: Single    Spouse name: Not on file  . Number of children: Not on file  . Years of education: Not on file  . Highest education level: Not on file  Occupational History  . Not on file  Social Needs  . Financial resource strain: Not on file  . Food insecurity:    Worry: Not on file    Inability: Not on file  . Transportation needs:    Medical: Not on file    Non-medical: Not on file  Tobacco Use  . Smoking status: Never Smoker  . Smokeless  tobacco: Never Used  Substance and Sexual Activity  . Alcohol use: Yes    Comment: occasional  . Drug use: No  . Sexual activity: Not on file  Lifestyle  . Physical activity:    Days per week: Not on file    Minutes per session: Not on file  . Stress: Not on file  Relationships  . Social connections:    Talks on phone: Not on file    Gets together: Not on file    Attends religious service: Not on file    Active member of club or organization: Not on file    Attends meetings of clubs or organizations: Not on file    Relationship status: Not on file  . Intimate partner violence:    Fear of current or ex partner: Not on file    Emotionally abused: Not on file    Physically abused: Not on file    Forced sexual activity: Not on file  Other Topics Concern  . Not on file  Social History Narrative  . Not on file    Past Surgical History:  Procedure Laterality Date  . ORIF TIBIA & FIBULA FRACTURES    . SHOULDER ARTHROSCOPY      History  reviewed. No pertinent family history.  Allergies  Allergen Reactions  . Peanut-Containing Drug Products Shortness Of Breath and Swelling    TREE NUTS ONLY-Almonds, Pecans, and hazelnuts    Current Medications:   Current Outpatient Medications:  .  benzonatate (TESSALON) 200 MG capsule, Take 1 capsule (200 mg total) by mouth 2 (two) times daily as needed for cough., Disp: 20 capsule, Rfl: 0 .  clindamycin (CLINDAGEL) 1 % gel, Apply topically 2 (two) times daily., Disp: 30 g, Rfl: 0 .  ipratropium (ATROVENT) 0.06 % nasal spray, Place 2 sprays into both nostrils 4 (four) times daily., Disp: 15 mL, Rfl: 0 .  azithromycin (ZITHROMAX) 250 MG tablet, Take two tablets on day 1, then one tablet daily x 4 days, Disp: 6 tablet, Rfl: 0   Review of Systems:   ROS  Negative unless otherwise specified per HPI.   Vitals:   Vitals:   04/09/18 1606  BP: 122/88  Pulse: 95  Temp: 98.3 F (36.8 C)  TempSrc: Oral  SpO2: 95%  Weight: (!) 366 lb  12.8 oz (166.4 kg)  Height: 6\' 2"  (1.88 m)     Body mass index is 47.09 kg/m.  Physical Exam:   Physical Exam Vitals signs and nursing note reviewed.  Constitutional:      General: He is not in acute distress.    Appearance: He is well-developed. He is not ill-appearing or toxic-appearing.  HENT:     Head: Normocephalic and atraumatic.     Right Ear: Tympanic membrane, ear canal and external ear normal. Tympanic membrane is not erythematous, retracted or bulging.     Left Ear: Tympanic membrane, ear canal and external ear normal. Tympanic membrane is not erythematous, retracted or bulging.     Nose: Mucosal edema, congestion and rhinorrhea present. Rhinorrhea is clear.     Right Sinus: No maxillary sinus tenderness or frontal sinus tenderness.     Left Sinus: No maxillary sinus tenderness or frontal sinus tenderness.     Mouth/Throat:     Pharynx: Uvula midline. No posterior oropharyngeal erythema.     Tonsils: No tonsillar exudate. Swelling: 2+ on the right. 2+ on the left.  Eyes:     General: Lids are normal.     Conjunctiva/sclera: Conjunctivae normal.  Neck:     Trachea: Trachea normal.  Cardiovascular:     Rate and Rhythm: Normal rate and regular rhythm.     Heart sounds: Normal heart sounds, S1 normal and S2 normal.  Pulmonary:     Effort: Pulmonary effort is normal.     Breath sounds: Normal breath sounds. No decreased breath sounds, wheezing, rhonchi or rales.  Musculoskeletal:       Feet:  Lymphadenopathy:     Cervical: No cervical adenopathy.  Skin:    General: Skin is warm and dry.  Neurological:     Mental Status: He is alert.  Psychiatric:        Speech: Speech normal.        Behavior: Behavior normal. Behavior is cooperative.     Results for orders placed or performed in visit on 04/09/18  POCT rapid strep A  Result Value Ref Range   Rapid Strep A Screen Negative Negative     Assessment and Plan:   Kyle Doyle was seen today for cough, generalized  body aches and sore throat.  Diagnoses and all orders for this visit:  Upper respiratory tract infection, unspecified type No red flags on exam.  Strep negative. Likely viral etiology,  however due to uncontrolled and untreated diabetes, will initiate azithromycin per orders. Discussed taking medications as prescribed. Reviewed return precautions including worsening fever, SOB, worsening cough or other concerns. Push fluids and rest. I recommend that patient follow-up if symptoms worsen or persist despite treatment x 7-10 days, sooner if needed. -     POCT rapid strep A  Type 2 diabetes mellitus with diabetic neuropathy, without long-term current use of insulin (HCC) Noncompliant and uncontrolled. Discussed need for close follow-up with PCP for further discussion and management of symptoms. He is asking for re-check of HgbA1c today however it has only been ~1 month since prior A1c. Education provided. Encouraged follow-up with PCP next week for further discussion and consideration of starting medication.  Other orders -     azithromycin (ZITHROMAX) 250 MG tablet; Take two tablets on day 1, then one tablet daily x 4 days  . Reviewed expectations re: course of current medical issues. . Discussed self-management of symptoms. . Outlined signs and symptoms indicating need for more acute intervention. . Patient verbalized understanding and all questions were answered. . See orders for this visit as documented in the electronic medical record. . Patient received an After-Visit Summary.  Jarold MottoSamantha Kayda Allers, PA-C

## 2018-04-11 ENCOUNTER — Encounter: Payer: Self-pay | Admitting: Physician Assistant

## 2018-04-11 LAB — POCT RAPID STREP A (OFFICE): Rapid Strep A Screen: NEGATIVE

## 2018-04-14 ENCOUNTER — Ambulatory Visit: Payer: Self-pay | Admitting: Family Medicine

## 2018-04-15 ENCOUNTER — Ambulatory Visit (INDEPENDENT_AMBULATORY_CARE_PROVIDER_SITE_OTHER): Payer: BC Managed Care – PPO | Admitting: Family Medicine

## 2018-04-15 ENCOUNTER — Encounter: Payer: Self-pay | Admitting: Family Medicine

## 2018-04-15 VITALS — BP 150/86 | HR 76 | Temp 98.7°F | Ht 74.0 in | Wt 372.4 lb

## 2018-04-15 DIAGNOSIS — R208 Other disturbances of skin sensation: Secondary | ICD-10-CM | POA: Diagnosis not present

## 2018-04-15 DIAGNOSIS — G4700 Insomnia, unspecified: Secondary | ICD-10-CM | POA: Diagnosis not present

## 2018-04-15 DIAGNOSIS — R030 Elevated blood-pressure reading, without diagnosis of hypertension: Secondary | ICD-10-CM

## 2018-04-15 DIAGNOSIS — R200 Anesthesia of skin: Secondary | ICD-10-CM | POA: Insufficient documentation

## 2018-04-15 DIAGNOSIS — E119 Type 2 diabetes mellitus without complications: Secondary | ICD-10-CM | POA: Insufficient documentation

## 2018-04-15 DIAGNOSIS — E1140 Type 2 diabetes mellitus with diabetic neuropathy, unspecified: Secondary | ICD-10-CM | POA: Diagnosis not present

## 2018-04-15 LAB — GLUCOSE, POCT (MANUAL RESULT ENTRY): POC Glucose: 253 mg/dl — AB (ref 70–99)

## 2018-04-15 MED ORDER — IPRATROPIUM BROMIDE 0.06 % NA SOLN: 2.0000 | Freq: Four times a day (QID) | NASAL | 0 refills | Status: DC

## 2018-04-15 MED ORDER — METFORMIN HCL ER 750 MG PO TB24: 750.0000 mg | ORAL_TABLET | Freq: Every day | ORAL | 1 refills | Status: DC

## 2018-04-15 NOTE — Assessment & Plan Note (Signed)
Above goal today.  Has previously been at goal.  Continue home blood pressure monitoring.  Follow-up with me in 3 months.

## 2018-04-15 NOTE — Assessment & Plan Note (Signed)
Based on physical exam, do not think that this represents true diabetic neuropathy, however his severely elevated blood sugars are possibly contributing.  Think most of his symptoms are due to anatomic changes related to remote fracture of his left tibial/fibula.  Monofilament testing was intact today.

## 2018-04-15 NOTE — Assessment & Plan Note (Signed)
Lengthy discussion with patient regarding treatment options.  Will start metformin 750 mg daily.  Discussed potential side effects.  He will follow-up with me in 3 months.  Recheck A1c at that time.  He will continue working on lifestyle modifications.

## 2018-04-15 NOTE — Patient Instructions (Signed)
It was very nice to see you today!  Please start metformin.  Please take 1 pill once daily in the morning.  Let me know if you have any side effects.  I will also resend in your nasal spray.  Please try taking 10 mg of melatonin about 30 minutes before you go to bed to help you with your sleep.  Come back to see me in 3 months, or sooner as needed.  Take care, Dr Jimmey RalphParker

## 2018-04-15 NOTE — Assessment & Plan Note (Signed)
Discussed treatment options.  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  Recommended melatonin 10 mg 30 minutes to an hour before bed.

## 2018-04-15 NOTE — Progress Notes (Signed)
   Subjective:  Kyle BeechWilliam H Doyle is a 27 y.o. male who presents today with a chief complaint of hyperlycemia.   HPI:  Type 2 diabetes, established problem Patient last seen about 6 weeks ago and found to have elevated A1c.  Patient declined starting pharmacotherapy at that point.  He was seen again about a week ago for an upper respiratory tract infection and was found to have some numbness in his feet bilaterally.  Again was directed to start medication however he declined again.  He follows up today to discuss treatment for his blood sugars.  He has been working on diet and exercise.  Exercises nearly every day.  Does not drink sugary foods.  Has been trying to make healthy food choices.  He is concerned about his body becoming "addicted" to a diabetic medication.  He has a strong family history of diabetes in both his mother and his grandparents.   Insomnia Symptoms started several years ago.  He works third shift at a prison.  Has had difficulty falling asleep and staying asleep when he changes shift.  No specific treatments tried.  He would like something to help him with his sleep.  No other obvious alleviating or aggravating factors.  ROS: Per HPI  PMH: He reports that he has never smoked. He has never used smokeless tobacco. He reports current alcohol use. He reports that he does not use drugs.  Objective:  Physical Exam: BP (!) 150/86 (BP Location: Left Arm, Patient Position: Sitting, Cuff Size: Large)   Pulse 76   Temp 98.7 F (37.1 C) (Oral)   Ht 6\' 2"  (1.88 m)   Wt (!) 372 lb 6.1 oz (168.9 kg)   SpO2 97%   BMI 47.81 kg/m   Gen: NAD, resting comfortably CV: RRR with no murmurs appreciated Pulm: NWOB, CTAB with no crackles, wheezes, or rhonchi GI: Normal bowel sounds present. Soft, Nontender, Nondistended. MSK:  -Lower extremities: Left lower extremity with some signs of chronic venous stasis changes and callus on the bottom of his foot.  No open lesions on either foot.   Monofilament testing intact to feet bilaterally.  DP and PT pulses 2+ bilaterally.  Assessment/Plan:  Type 2 diabetes mellitus with diabetic neuropathy, without long-term current use of insulin (HCC) Lengthy discussion with patient regarding treatment options.  Will start metformin 750 mg daily.  Discussed potential side effects.  He will follow-up with me in 3 months.  Recheck A1c at that time.  He will continue working on lifestyle modifications.  Numbness of left foot Based on physical exam, do not think that this represents true diabetic neuropathy, however his severely elevated blood sugars are possibly contributing.  Think most of his symptoms are due to anatomic changes related to remote fracture of his left tibial/fibula.  Monofilament testing was intact today.  Insomnia Discussed treatment options.  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  Recommended melatonin 10 mg 30 minutes to an hour before bed.  Elevated blood pressure reading Above goal today.  Has previously been at goal.  Continue home blood pressure monitoring.  Follow-up with me in 3 months.    Katina Degreealeb M. Jimmey RalphParker, MD 04/15/2018 12:30 PM

## 2018-06-07 ENCOUNTER — Ambulatory Visit: Payer: BC Managed Care – PPO | Admitting: Family Medicine

## 2018-06-07 NOTE — Progress Notes (Deleted)
[facility]  PATIENT: Kyle Doyle DOB: Jul 27, 1990   REASON FOR VISIT: Obstructive sleep apnea with BiPAP HISTORY FROM:    HISTORY OF PRESENT ILLNESS: Kyle Doyle is a 28 year old right-handed gentleman with an underlying medical history of hypertension, asthma, knee pain, allergic rhinitis and morbid obesity with a BMI of over 35, who presents for follow-up consultation of his obstructive sleep apnea, after recent sleep study testing and starting BiPAP therapy at home. The patient is unaccompanied today. I first met him on 07/28/2017, at which time he reported a prior diagnosis of sleep apnea and CPAP therapy in the past but he no longer had a CPAP machine. He was advised to proceed with a sleep study. He had a split-night sleep study on 11/11/2017. I went over his test results with him in detail today. Baseline sleep efficiency was 94.3%, sleep latency 2.5 minutes and REM sleep was absent. Total AHI was rather high at 154.5 per hour. He had an average oxygen saturation of 92%, nadir was in the severe range at 68%. He had no significant PLMS. He was started on CPAP and titrated from 6 cm to 18 cm. He still had residual sleep disordered breathing with a residual AHI of 15.7 on a pressure of 18 cm, O2 nadir of 87%. He was therefore switched to BiPAP secondary to high pressure requirement. He was further titrated to 20/16 cm. On the final pressure his AHI was 0 per hour with supine REM sleep achieved and O2 nadir very briefly at 82%. Based on his test results I prescribed BiPAP therapy for home use a pressure of 20/16 cm.  Today, 03/02/2018:SA I reviewed his BiPAP compliance data from the past 30 days from 01/30/2018 through 02/28/2018, during which time he used his BiPAP 24 days with percent used days greater than 4 hours at 37%, indicating suboptimal compliance. In the past 97 days he used his machine 73 days with percent used days greater than 4 hours at 33% only, average usage  of 3 hours and 39 minutes, residual AHI at goal at 3.3 per hour, leak acceptable with the 95th percentile at 10 L/m on a pressure of 20/16. He reports that he has some trouble tolerating the pressure. He uses a fullface mask. He would like to get new supplies and also requests a pressure reduction. He is motivated to continue with treatment. He admits that he has skipped some nights and also does not always put the mask on when he naps. 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 He tries to put it on around 8 AM and generally tries to sleep till 2 PM. He also takes some extended naps but has not been using his BiPAP during naps. He would be willing to increase his usage. He inquired about other treatment options including surgical options but declines an ENT referral at this time.   REVIEW OF SYSTEMS: Full 14 system review of systems performed and notable only for those listed, all others are neg:  Constitutional: neg  Cardiovascular: neg Ear/Nose/Throat: neg  Skin: neg Eyes: neg Respiratory: neg Gastroitestinal: neg  Hematology/Lymphatic: neg  Endocrine: neg Musculoskeletal:neg Allergy/Immunology: neg Neurological: neg Psychiatric: neg Sleep : neg   ALLERGIES: Allergies  Allergen Reactions  . Peanut-Containing Drug Products Shortness Of Breath and Swelling    TREE NUTS ONLY-Almonds, Pecans, and hazelnuts    HOME MEDICATIONS: Outpatient Medications Prior to Visit  Medication Sig Dispense Refill  . clindamycin (CLINDAGEL) 1 % gel Apply topically 2 (two) times daily. [place]  g 0  . ipratropium (ATROVENT) 0.06 % nasal spray Place 2 sprays into both nostrils 4 (four) times daily. 15 mL 0  . metFORMIN (GLUCOPHAGE XR) 750 MG 24 hr tablet Take 1 tablet (750 mg total) by mouth daily with breakfast. 90 tablet 1   No facility-administered medications prior to visit.     PAST MEDICAL HISTORY: Past Medical History:  Diagnosis Date  . Asthma   . Hypertension     PAST SURGICAL HISTORY: Past Surgical History:    Procedure Laterality Date  . ORIF TIBIA & FIBULA FRACTURES    . SHOULDER ARTHROSCOPY      FAMILY HISTORY: No family history on file.  SOCIAL HISTORY: Social History   Socioeconomic History  . Marital status: Single    Spouse name: Not on file  . Number of children: Not on file  . Years of education: Not on file  . Highest education level: Not on file  Occupational History  . Not on file  Social Needs  . Financial resource strain: Not on file  . Food insecurity:    Worry: Not on file    Inability: Not on file  . Transportation needs:    Medical: Not on file    Non-medical: Not on file  Tobacco Use  . Smoking status: Never Smoker  . Smokeless tobacco: Never Used  Substance and Sexual Activity  . Alcohol use: Yes    Comment: occasional  . Drug use: No  . Sexual activity: Not on file  Lifestyle  . Physical activity:    Days per week: Not on file    Minutes per session: Not on file  . Stress: Not on file  Relationships  . Social connections:    Talks on phone: Not on file    Gets together: Not on file    Attends religious service: Not on file    Active member of club or organization: Not on file    Attends meetings of clubs or organizations: Not on file    Relationship status: Not on file  . Intimate partner violence:    Fear of current or ex partner: Not on file    Emotionally abused: Not on file    Physically abused: Not on file    Forced sexual activity: Not on file  Other Topics Concern  . Not on file  Social History Narrative  . Not on file     PHYSICAL EXAM  There were no vitals filed for this visit. There is no height or weight on file to calculate BMI.  Generalized: Well developed, in no acute distress  Head: normocephalic and atraumatic,. Oropharynx benign  Neck: Supple, no carotid bruits  Cardiac: Regular rate rhythm, no murmur  Musculoskeletal: No deformity   Neurological examination   Mentation: Alert oriented to time, place, history  taking. Attention span and concentration appropriate. Recent and remote memory intact.  Follows all commands speech and language fluent.   Cranial nerve II-XII: Fundoscopic exam reveals sharp disc margins.Pupils were equal round reactive to light extraocular movements were full, visual field were full on confrontational test. Facial sensation and strength were normal. hearing was intact to finger rubbing bilaterally. Uvula tongue midline. head turning and shoulder shrug were normal and symmetric.Tongue protrusion into cheek strength was normal. Motor: normal bulk and tone, full strength in the BUE, BLE, fine finger movements normal, no pronator drift. No focal weakness Sensory: normal and symmetric to light touch, pinprick, and  Vibration, proprioception  Coordination: finger-nose-finger, heel-to-shin  bilaterally, no dysmetria Reflexes: Brachioradialis 2/2, biceps 2/2, triceps 2/2, patellar 2/2, Achilles 2/2, plantar responses were flexor bilaterally. Gait and Station: Rising up from seated position without assistance, normal stance,  moderate stride, good arm swing, smooth turning, able to perform tiptoe, and heel walking without difficulty. Tandem gait is steady  DIAGNOSTIC DATA (LABS, IMAGING, TESTING) - I reviewed patient records, labs, notes, testing and imaging myself where available.  Lab Results  Component Value Date   WBC 9.0 06/24/2017   HGB 14.4 06/24/2017   HCT 42.3 06/24/2017   MCV 84.8 06/24/2017   PLT 308.0 06/24/2017      Component Value Date/Time   NA 138 06/24/2017 0949   K 3.5 06/24/2017 0949   CL 101 06/24/2017 0949   CO2 28 06/24/2017 0949   GLUCOSE 122 (H) 06/24/2017 0949   BUN 11 06/24/2017 0949   CREATININE 0.87 06/24/2017 0949   CALCIUM 9.9 06/24/2017 0949   PROT 7.7 06/24/2017 0949   ALBUMIN 4.3 06/24/2017 0949   AST 21 06/24/2017 0949   ALT 28 06/24/2017 0949   ALKPHOS 68 06/24/2017 0949   BILITOT 0.4 06/24/2017 0949   GFRNONAA >60 09/26/2016 1508    GFRAA >60 09/26/2016 1508   Lab Results  Component Value Date   CHOL 152 06/24/2017   HDL 42.70 06/24/2017   LDLCALC 97 06/24/2017   TRIG 61.0 06/24/2017   CHOLHDL 4 06/24/2017   Lab Results  Component Value Date   HGBA1C 11.4 (A) 03/05/2018   No results found for: VITAMINB12 Lab Results  Component Value Date   TSH 2.35 06/24/2017    ***  ASSESSMENT AND PLAN  28 y.o. year old male  has a past medical history of Asthma and Hypertension. here with ***  Princess Bruins Foxxis a very pleasant 89 year oldmalewith an underlying medical history of hypertension, asthma, knee pain, allergic rhinitis and morbid obesity with a BMI of over 67, who presents for FU consultation of his severe OSA. He is on BiPAP with good apnea control. While he puts on his BiPAP most nights or days (due to shift work), he does not keep it on long enough (compliance for 4 hours or more is suboptimal). However, he also reports sleep disruption and difficulty tolerating the pressure. He is motivated to continue and commended for his trying but strongly encouraged to be consistent with BiPAP therapy and also work aggressively on weight loss. We talked about his sleep study results in detail. We also reviewed his compliance data and I also reviewed his compliance data based on his data chip in the machine and compliance was the same compared to the wireless compliance data. He is advised to use his BiPAP with all naps as well. He is advised to get in touch with his DME company regarding supplies. I will reduce the pressure by one notch to 19/15 cm with better tolerance. This may result in a slight increase in the residual sleep disordered breathing but it is worth trying, especially if it results in better compliance overall.  I again explained in particular the risks and ramifications of untreated moderate to severe OSA, especially with respect to developing cardiovascular disease down the Road, including congestive heart  failure, difficult to treat hypertension, cardiac arrhythmias, or stroke.  I again explained the importance of being compliant with PAP treatment, not only for insurance purposes but primarily to improve Hissymptoms, and for the patient's long term health benefit, including to reduce Hiscardiovascular risks.he is advised to follow-up in 3  months, sooner   Dennie Bible, [facility], [facility] APRN  [facility] [address] [place], [place] [id number] [phone]

## 2018-06-08 ENCOUNTER — Ambulatory Visit: Payer: BC Managed Care – PPO | Admitting: Nurse Practitioner

## 2018-06-09 ENCOUNTER — Encounter: Payer: Self-pay | Admitting: Nurse Practitioner

## 2018-06-17 ENCOUNTER — Ambulatory Visit (HOSPITAL_COMMUNITY)
Admission: EM | Admit: 2018-06-17 | Discharge: 2018-06-17 | Disposition: A | Discharge status: Home / Self Care | Payer: BC Managed Care – PPO | Attending: Internal Medicine | Admitting: Internal Medicine

## 2018-06-17 ENCOUNTER — Other Ambulatory Visit: Payer: Self-pay

## 2018-06-17 ENCOUNTER — Encounter (HOSPITAL_COMMUNITY): Payer: Self-pay | Admitting: Emergency Medicine

## 2018-06-17 DIAGNOSIS — R072 Precordial pain: Secondary | ICD-10-CM

## 2018-06-17 NOTE — ED Triage Notes (Signed)
Congestion, cough, headache, body aches for 4 days.

## 2018-06-17 NOTE — ED Provider Notes (Signed)
[facility]    CSN: [id number] Arrival date & time: 06/17/18  1735     History   Chief Complaint Chief Complaint  Patient presents with  . URI    HPI Kyle Doyle is a 28 y.o. male with a history of hypertension, diabetes mellitus type 2 with hemoglobin A1c of 11.3 comes to the emergency department with complaints of congestion, cough and headache as well as body aches for 4 days.  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  He is concerned that he may have influenza.  Patient also complains of precordial chest pressure which has been intermittent and is gotten lately worse.  Pressure is precordial.  No known aggravating factors.  No known relieving factors.  Patient says that he has reflux symptoms when he eats but denies any nausea vomiting or diaphoresis.  He was recently diagnosed with diabetes mellitus type 2 but has not started taking metformin.  HPI  Past Medical History:  Diagnosis Date  . Asthma   . Hypertension     Patient Active Problem List   Diagnosis Date Noted  . Type 2 diabetes mellitus with diabetic neuropathy, without long-term current use of insulin (HCC) 04/15/2018  . Insomnia 04/15/2018  . Numbness of left foot 04/15/2018  . Acne vulgaris 03/05/2018  . Elevated blood pressure reading 06/24/2017  . Morbid obesity (HCC) 06/24/2017  . Sleep-disordered breathing 06/24/2017  . Chronic pain of both knees 06/24/2017  . ALLERGIC RHINITIS 01/02/2009    Past Surgical History:  Procedure Laterality Date  . ORIF TIBIA & FIBULA FRACTURES    . SHOULDER ARTHROSCOPY         Home Medications    Prior to Admission medications   Medication Sig Start Date End Date Taking? Authorizing Provider  clindamycin (CLINDAGEL) 1 % gel Apply topically 2 (two) times daily. 03/05/18   Ardith Dark, MD  ipratropium (ATROVENT) 0.06 % nasal spray Place 2 sprays into both nostrils 4 (four) times daily. 04/15/18   Ardith Dark,  MD  metFORMIN (GLUCOPHAGE XR) 750 MG 24 hr tablet Take 1 tablet (750 mg total) by mouth daily with breakfast. 04/15/18   Ardith Dark, MD    Family History No family history on file.  Social History Social History   Tobacco Use  . Smoking status: Never Smoker  . Smokeless tobacco: Never Used  Substance Use Topics  . Alcohol use: Yes    Comment: occasional  . Drug use: No     Allergies   Peanut-containing drug products   Review of Systems Review of Systems  Constitutional: Positive for activity change, chills, fatigue and fever.  HENT: Positive for postnasal drip, rhinorrhea and sore throat. Negative for hearing loss and mouth sores.   Eyes: Negative for pain and itching.  Respiratory: Positive for chest tightness. Negative for shortness of breath and wheezing.   Gastrointestinal: Negative for abdominal distention and abdominal pain.  Endocrine: Positive for polydipsia and polyuria.  Genitourinary: Negative for dysuria, frequency and urgency.  Musculoskeletal: Positive for arthralgias, back pain and myalgias. Negative for joint swelling.  Neurological: Negative for dizziness, weakness and light-headedness.  Hematological: Negative for adenopathy.  Psychiatric/Behavioral: Negative for confusion and hallucinations.     Physical Exam Triage Vital Signs ED Triage Vitals  Enc Vitals Group     BP 06/17/18 1800 (!) 159/106     Pulse Rate 06/17/18 1800 96     Resp 06/17/18 1800 16  Temp 06/17/18 1800 98.4 F (36.9 C)     Temp Source 06/17/18 1800 Oral     SpO2 06/17/18 1800 100 %     Weight --      Height --      Head Circumference --      Peak Flow --      Pain Score 06/17/18 1801 9     Pain Loc --      Pain Edu? --      Excl. in GC? --    No data found.  Updated Vital Signs BP (!) 159/106   Pulse 96   Temp 98.4 F (36.9 C) (Oral)   Resp 16   SpO2 100%   Visual Acuity Right Eye Distance:   Left Eye Distance:   Bilateral Distance:    Right Eye  Near:   Left Eye Near:    Bilateral Near:     Physical Exam Constitutional:      General: He is in acute distress.     Appearance: He is ill-appearing. He is not diaphoretic.  HENT:     Nose: Congestion present.     Mouth/Throat:     Mouth: Mucous membranes are moist.     Pharynx: No posterior oropharyngeal erythema.  Eyes:     Conjunctiva/sclera: Conjunctivae normal.  Neck:     Musculoskeletal: No neck rigidity.  Cardiovascular:     Rate and Rhythm: Normal rate and regular rhythm.     Pulses: Normal pulses.     Heart sounds: Normal heart sounds.  Pulmonary:     Effort: Pulmonary effort is normal. No respiratory distress.     Breath sounds: Normal breath sounds. No wheezing.  Abdominal:     General: Abdomen is flat. There is no distension.     Palpations: Abdomen is soft.     Tenderness: There is no abdominal tenderness. There is no guarding.  Musculoskeletal: Normal range of motion.  Lymphadenopathy:     Cervical: No cervical adenopathy.  Skin:    General: Skin is warm.     Capillary Refill: Capillary refill takes less than 2 seconds.     Findings: No erythema or rash.  Neurological:     General: No focal deficit present.     Mental Status: He is alert and oriented to person, place, and time.      UC Treatments / Results  Labs (all labs ordered are listed, but only abnormal results are displayed) Labs Reviewed - No data to display  EKG None  Radiology No results found.  Procedures Procedures (including critical care time)  Medications Ordered in UC Medications - No data to display  Initial Impression / Assessment and Plan / UC Course  I have reviewed the triage vital signs and the nursing notes.  Pertinent labs & imaging results that were available during my care of the patient were reviewed by me and considered in my medical decision making (see chart for details).     1. Influenza infection with respiratory symptoms: Patient will benefit from  Tamiflu Influenza diagnosis may need to be confirmed since patient will be evaluated for chest pain  2.  Chest pain with risk factors (diabetes mellitus type 2, hypertension, morbid obesity): Patient advised to go to the emergency department He will need a chest pain rule out and possible stress testing if indicated  3.  Diabetes mellitus type 2 (hemoglobin A1c is 11.3): Patient may need to be on insulin for blood sugar control He is supposed  to be on Metformin  Final Clinical Impressions(s) / UC Diagnoses   Final diagnoses:  None   Discharge Instructions   None    ED Prescriptions    None     Controlled Substance Prescriptions [place] Controlled Substance Registry consulted? Not Applicable   Merrilee JanskyLamptey, Lakeesha Fontanilla O, MD 06/17/18 Paulo [id number]

## 2018-06-18 ENCOUNTER — Ambulatory Visit: Payer: BC Managed Care – PPO | Admitting: Family Medicine

## 2018-06-18 ENCOUNTER — Encounter: Payer: Self-pay | Admitting: Family Medicine

## 2018-06-18 VITALS — BP 148/96 | HR 85 | Temp 98.1°F | Ht 74.0 in | Wt 367.8 lb

## 2018-06-18 DIAGNOSIS — J020 Streptococcal pharyngitis: Secondary | ICD-10-CM | POA: Diagnosis not present

## 2018-06-18 DIAGNOSIS — R079 Chest pain, unspecified: Secondary | ICD-10-CM | POA: Diagnosis not present

## 2018-06-18 DIAGNOSIS — R05 Cough: Secondary | ICD-10-CM | POA: Diagnosis not present

## 2018-06-18 DIAGNOSIS — R52 Pain, unspecified: Secondary | ICD-10-CM | POA: Diagnosis not present

## 2018-06-18 DIAGNOSIS — J029 Acute pharyngitis, unspecified: Secondary | ICD-10-CM

## 2018-06-18 DIAGNOSIS — R059 Cough, unspecified: Secondary | ICD-10-CM

## 2018-06-18 LAB — POCT RAPID STREP A (OFFICE): RAPID STREP A SCREEN: POSITIVE — AB

## 2018-06-18 LAB — POC INFLUENZA A&B (BINAX/QUICKVUE)
INFLUENZA A, POC: NEGATIVE
Influenza B, POC: NEGATIVE

## 2018-06-18 MED ORDER — AMOXICILLIN 875 MG PO TABS: 875.0000 mg | ORAL_TABLET | Freq: Two times a day (BID) | ORAL | 0 refills | Status: DC

## 2018-06-18 NOTE — Patient Instructions (Signed)
It was very nice to see you today!  You have strep throat. Please start the amoxicillin.    Take ibuprofen and tylenol as needed.  Let me know if your BP is persistently 140/90 or higher.   Let us know if your symptoms worsen or do not improve in the next 5-7 days.   Take care, Dr [name]
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
## 2018-07-16 ENCOUNTER — Ambulatory Visit: Payer: BC Managed Care – PPO | Admitting: Family Medicine

## 2018-11-29 ENCOUNTER — Telehealth: Payer: Self-pay

## 2018-11-29 NOTE — Telephone Encounter (Signed)
Received notice from [employer] that pt's bipap machine had to be returned due to non compliance.   I called pt to discuss. A male answered and advised me that he will call me back later. I will also send pt a mychart message.

## 2019-06-06 ENCOUNTER — Emergency Department (HOSPITAL_COMMUNITY): Payer: BC Managed Care – PPO

## 2019-06-06 ENCOUNTER — Encounter (HOSPITAL_COMMUNITY): Payer: Self-pay | Admitting: Emergency Medicine

## 2019-06-06 ENCOUNTER — Emergency Department (HOSPITAL_COMMUNITY)
Admission: EM | Admit: 2019-06-06 | Discharge: 2019-06-07 | Disposition: A | Discharge status: Home / Self Care | Payer: BC Managed Care – PPO | Attending: Emergency Medicine | Admitting: Emergency Medicine

## 2019-06-06 ENCOUNTER — Other Ambulatory Visit: Payer: Self-pay

## 2019-06-06 DIAGNOSIS — Y9389 Activity, other specified: Secondary | ICD-10-CM | POA: Diagnosis not present

## 2019-06-06 DIAGNOSIS — J45909 Unspecified asthma, uncomplicated: Secondary | ICD-10-CM | POA: Insufficient documentation

## 2019-06-06 DIAGNOSIS — Z79899 Other long term (current) drug therapy: Secondary | ICD-10-CM | POA: Diagnosis not present

## 2019-06-06 DIAGNOSIS — Y9289 Other specified places as the place of occurrence of the external cause: Secondary | ICD-10-CM | POA: Insufficient documentation

## 2019-06-06 DIAGNOSIS — E1140 Type 2 diabetes mellitus with diabetic neuropathy, unspecified: Secondary | ICD-10-CM | POA: Insufficient documentation

## 2019-06-06 DIAGNOSIS — M546 Pain in thoracic spine: Secondary | ICD-10-CM | POA: Diagnosis not present

## 2019-06-06 DIAGNOSIS — M542 Cervicalgia: Secondary | ICD-10-CM | POA: Diagnosis present

## 2019-06-06 DIAGNOSIS — M5410 Radiculopathy, site unspecified: Secondary | ICD-10-CM | POA: Diagnosis not present

## 2019-06-06 DIAGNOSIS — Z7984 Long term (current) use of oral hypoglycemic drugs: Secondary | ICD-10-CM | POA: Diagnosis not present

## 2019-06-06 DIAGNOSIS — M479 Spondylosis, unspecified: Secondary | ICD-10-CM

## 2019-06-06 DIAGNOSIS — I10 Essential (primary) hypertension: Secondary | ICD-10-CM | POA: Insufficient documentation

## 2019-06-06 DIAGNOSIS — Y999 Unspecified external cause status: Secondary | ICD-10-CM | POA: Insufficient documentation

## 2019-06-06 MED ORDER — OXYCODONE-ACETAMINOPHEN 5-325 MG PO TABS
1.0000 | ORAL_TABLET | Freq: Once | ORAL | Status: AC
  Administered 2019-06-06: 23:00:00 1 via ORAL
  Filled 2019-06-06: qty 1

## 2019-06-06 MED ORDER — DIAZEPAM 5 MG/ML IJ SOLN
5.0000 mg | Freq: Once | INTRAMUSCULAR | Status: AC
  Administered 2019-06-06: 23:00:00 5 mg via INTRAMUSCULAR
  Filled 2019-06-06: qty 2

## 2019-06-06 MED ORDER — KETOROLAC TROMETHAMINE 60 MG/2ML IM SOLN
60.0000 mg | Freq: Once | INTRAMUSCULAR | Status: AC
  Administered 2019-06-06: 23:00:00 60 mg via INTRAMUSCULAR
  Filled 2019-06-06: qty 2

## 2019-06-06 NOTE — ED Notes (Signed)
Patient transported to CT 
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
## 2019-06-07 MED ORDER — PREDNISONE 20 MG PO TABS: 40.0000 mg | ORAL_TABLET | Freq: Every day | ORAL | 0 refills | Status: AC

## 2019-06-07 MED ORDER — METHOCARBAMOL 500 MG PO TABS: 500.0000 mg | ORAL_TABLET | Freq: Two times a day (BID) | ORAL | 0 refills | Status: DC
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
## 2019-06-30 ENCOUNTER — Encounter (HOSPITAL_COMMUNITY): Payer: Self-pay | Admitting: Emergency Medicine

## 2019-06-30 ENCOUNTER — Ambulatory Visit (HOSPITAL_COMMUNITY)
Admission: EM | Admit: 2019-06-30 | Discharge: 2019-06-30 | Disposition: A | Discharge status: Home / Self Care | Payer: BC Managed Care – PPO | Attending: Family Medicine | Admitting: Family Medicine

## 2019-06-30 ENCOUNTER — Other Ambulatory Visit: Payer: Self-pay

## 2019-06-30 DIAGNOSIS — Z20822 Contact with and (suspected) exposure to covid-19: Secondary | ICD-10-CM | POA: Insufficient documentation

## 2019-06-30 DIAGNOSIS — R197 Diarrhea, unspecified: Secondary | ICD-10-CM | POA: Insufficient documentation

## 2019-06-30 NOTE — ED Provider Notes (Signed)
[facility]    CSN: [id number] Arrival date & time: 06/30/19  1238      History   Chief Complaint Chief Complaint  Patient presents with  . Diarrhea    HPI Kyle Doyle is a 29 y.o. male.   Patient is a 29 year old male who presents today with approximately 3 days of diarrhea.  Reporting worse today with approximately 5-6 episodes of diarrhea.  Describes as semiwatery semisoft.  Denies any blood in stool.  Having some rectal burning and irritation from wiping.  Reporting this kind of problem happens to him from time to time.  He does work any present and is tested weekly for Covid.  Was tested earlier this week with negative results.  He was not having any symptoms at that time.  Denies any associated cough, chest congestion, rhinorrhea, fever, abdominal pain, nausea, vomiting, urinary symptoms.  Denies any recent traveling or recent antibiotic use.  Denies any recent sick contacts.  Feels mildly dehydrated but is able to drink fluids.  ROS per HPI    Diarrhea   Past Medical History:  Diagnosis Date  . Asthma   . Hypertension     Patient Active Problem List   Diagnosis Date Noted  . Type 2 diabetes mellitus with diabetic neuropathy, without long-term current use of insulin (HCC) 04/15/2018  . Insomnia 04/15/2018  . Numbness of left foot 04/15/2018  . Acne vulgaris 03/05/2018  . Elevated blood pressure reading 06/24/2017  . Morbid obesity (HCC) 06/24/2017  . Sleep-disordered breathing 06/24/2017  . Chronic pain of both knees 06/24/2017  . ALLERGIC RHINITIS 01/02/2009    Past Surgical History:  Procedure Laterality Date  . ORIF TIBIA & FIBULA FRACTURES    . SHOULDER ARTHROSCOPY         Home Medications    Prior to Admission medications   Medication Sig Start Date End Date Taking? Authorizing Provider  ipratropium (ATROVENT) 0.06 % nasal spray Place 2 sprays into both nostrils 4 (four) times daily. 04/15/18   Ardith Dark, MD  methocarbamol  (ROBAXIN) 500 MG tablet Take 1 tablet (500 mg total) by mouth 2 (two) times daily. Patient not taking: Reported on 06/30/2019 06/07/19   Maxwell Caul, PA-C  metFORMIN (GLUCOPHAGE XR) 750 MG 24 hr tablet Take 1 tablet (750 mg total) by mouth daily with breakfast. Patient not taking: Reported on 06/30/2019 04/15/18 06/30/19  Ardith Dark, MD    Family History History reviewed. No pertinent family history.  Social History Social History   Tobacco Use  . Smoking status: Never Smoker  . Smokeless tobacco: Never Used  Substance Use Topics  . Alcohol use: Yes    Comment: occasional  . Drug use: No     Allergies   Peanut-containing drug products   Review of Systems Review of Systems  Gastrointestinal: Positive for diarrhea.     Physical Exam Triage Vital Signs ED Triage Vitals  Enc Vitals Group     BP 06/30/19 1321 (!) 174/100     Pulse Rate 06/30/19 1321 95     Resp 06/30/19 1321 18     Temp 06/30/19 1321 98.8 F (37.1 C)     Temp Source 06/30/19 1321 Oral     SpO2 06/30/19 1321 95 %     Weight --      Height --      Head Circumference --      Peak Flow --      Pain Score 06/30/19 1322 4  Pain Loc --      Pain Edu? --      Excl. in [place]? --    No data found.  Updated Vital Signs BP (!) 174/100 (BP Location: Left Arm)   Pulse 95   Temp 98.8 F (37.1 C) (Oral)   Resp 18   SpO2 95%   Visual Acuity Right Eye Distance:   Left Eye Distance:   Bilateral Distance:    Right Eye Near:   Left Eye Near:    Bilateral Near:     Physical Exam Vitals and nursing note reviewed.  Constitutional:      Appearance: Normal appearance.  HENT:     Head: Normocephalic and atraumatic.     Nose: Nose normal.  Eyes:     Conjunctiva/sclera: Conjunctivae normal.  Pulmonary:     Effort: Pulmonary effort is normal.  Musculoskeletal:        General: Normal range of motion.     Cervical back: Normal range of motion.  Skin:    General: Skin is warm and dry.    Neurological:     Mental Status: He is alert.  Psychiatric:        Mood and Affect: Mood normal.      UC Treatments / Results  Labs (all labs ordered are listed, but only abnormal results are displayed) Labs Reviewed  NOVEL CORONAVIRUS, NAA (HOSP ORDER, SEND-OUT TO REF LAB; TAT 18-24 HRS)    EKG   Radiology No results found.  Procedures Procedures (including critical care time)  Medications Ordered in UC Medications - No data to display  Initial Impression / Assessment and Plan / UC Course  I have reviewed the triage vital signs and the nursing notes.  Pertinent labs & imaging results that were available during my care of the patient were reviewed by me and considered in my medical decision making (see chart for details).     [place] likely some sort of viral illness or diet associated. He is not having any concerning signs, symptoms and his vital signs are stable.  He does not appear to be dehydrated. Recommended drink fluids to stay hydrated to include Gatorade and water.  He can do Imodium over-the-counter to help with the diarrhea. Covid swab sent for testing with labs pending. Work note given and recommended follow-up for any continued or worsening problems Final Clinical Impressions(s) / UC Diagnoses   Final diagnoses:  Diarrhea, unspecified type     Discharge Instructions     Make sure that you are staying hydrated.  Gatorade and water are a good choice.  You can try some imodium OTC to help with the diarrhea.  If your symptoms worsen please follow up.  Covid swab sent for testing.  Work note given      ED Prescriptions    None     PDMP not reviewed this encounter.   Loura Halt A, NP 06/30/19 1500

## 2019-06-30 NOTE — Discharge Instructions (Addendum)
Make sure that you are staying hydrated.  Gatorade and water are a good choice.  You can try some imodium OTC to help with the diarrhea.  If your symptoms worsen please follow up.  Covid swab sent for testing.  Work note given

## 2019-06-30 NOTE — ED Triage Notes (Signed)
Pt here for diarrhea x 3 days worse today; pt sts has weekly covid testing as [facility] and was negative this week

## 2019-07-04 LAB — NOVEL CORONAVIRUS, NAA (HOSP ORDER, SEND-OUT TO REF LAB; TAT 18-24 HRS): SARS-CoV-2, NAA: NOT DETECTED

## 2019-10-24 ENCOUNTER — Other Ambulatory Visit: Payer: BC Managed Care – PPO

## 2020-01-17 ENCOUNTER — Other Ambulatory Visit: Payer: BC Managed Care – PPO

## 2020-01-18 ENCOUNTER — Other Ambulatory Visit: Payer: BC Managed Care – PPO
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
## 2021-01-22 DIAGNOSIS — E785 Hyperlipidemia, unspecified: Secondary | ICD-10-CM | POA: Insufficient documentation

## 2023-10-23 DIAGNOSIS — L84 Corns and callosities: Secondary | ICD-10-CM | POA: Insufficient documentation

## 2023-10-23 DIAGNOSIS — M2140 Flat foot [pes planus] (acquired), unspecified foot: Secondary | ICD-10-CM | POA: Insufficient documentation

## 2024-02-16 ENCOUNTER — Other Ambulatory Visit: Payer: Self-pay

## 2024-02-16 ENCOUNTER — Ambulatory Visit (HOSPITAL_COMMUNITY)
Admission: EM | Admit: 2024-02-16 | Discharge: 2024-02-16 | Disposition: A | Discharge status: Home / Self Care | Attending: Internal Medicine | Admitting: Internal Medicine

## 2024-02-16 ENCOUNTER — Encounter (HOSPITAL_COMMUNITY): Payer: Self-pay | Admitting: *Deleted

## 2024-02-16 ENCOUNTER — Telehealth (HOSPITAL_COMMUNITY): Payer: Self-pay | Admitting: Internal Medicine

## 2024-02-16 ENCOUNTER — Ambulatory Visit (INDEPENDENT_AMBULATORY_CARE_PROVIDER_SITE_OTHER)

## 2024-02-16 DIAGNOSIS — M79645 Pain in left finger(s): Secondary | ICD-10-CM | POA: Diagnosis present

## 2024-02-16 DIAGNOSIS — R3589 Other polyuria: Secondary | ICD-10-CM | POA: Insufficient documentation

## 2024-02-16 DIAGNOSIS — E1165 Type 2 diabetes mellitus with hyperglycemia: Secondary | ICD-10-CM | POA: Diagnosis not present

## 2024-02-16 DIAGNOSIS — I10 Essential (primary) hypertension: Secondary | ICD-10-CM | POA: Diagnosis present

## 2024-02-16 LAB — HEMOGLOBIN A1C
Hgb A1c MFr Bld: 10.4 % — ABNORMAL HIGH (ref 4.8–5.6)
Mean Plasma Glucose: 251.78 mg/dL

## 2024-02-16 LAB — COMPREHENSIVE METABOLIC PANEL WITH GFR
ALT: 21 U/L (ref 0–44)
AST: 22 U/L (ref 15–41)
Albumin: 4 g/dL (ref 3.5–5.0)
Alkaline Phosphatase: 76 U/L (ref 38–126)
Anion gap: 11 (ref 5–15)
BUN: 7 mg/dL (ref 6–20)
CO2: 24 mmol/L (ref 22–32)
Calcium: 9.3 mg/dL (ref 8.9–10.3)
Chloride: 99 mmol/L (ref 98–111)
Creatinine, Ser: 0.73 mg/dL (ref 0.61–1.24)
GFR, Estimated: >60 mL/min (ref >60)
Glucose, Bld: 210 mg/dL — ABNORMAL HIGH (ref 70–99)
Potassium: 4.1 mmol/L (ref 3.5–5.1)
Sodium: 134 mmol/L — ABNORMAL LOW (ref 135–145)
Total Bilirubin: 1.1 mg/dL (ref 0.0–1.2)
Total Protein: 8 g/dL (ref 6.5–8.1)

## 2024-02-16 LAB — POCT URINALYSIS DIP (MANUAL ENTRY)
Bilirubin, UA: NEGATIVE
Blood, UA: NEGATIVE
Glucose, UA: >=1000 mg/dL — AB
Leukocytes, UA: NEGATIVE
Nitrite, UA: NEGATIVE
Protein Ur, POC: 30 mg/dL — AB
Spec Grav, UA: 1.025 (ref 1.010–1.025)
Urobilinogen, UA: 0.2 U/dL
pH, UA: 6.5 (ref 5.0–8.0)

## 2024-02-16 LAB — CBC
HCT: 46.2 % (ref 39.0–52.0)
Hemoglobin: 15.6 g/dL (ref 13.0–17.0)
MCH: 28.5 pg (ref 26.0–34.0)
MCHC: 33.8 g/dL (ref 30.0–36.0)
MCV: 84.3 fL (ref 80.0–100.0)
Platelets: 305 K/uL (ref 150–400)
RBC: 5.48 MIL/uL (ref 4.22–5.81)
RDW: 12.3 % (ref 11.5–15.5)
WBC: 6.2 K/uL (ref 4.0–10.5)
nRBC: 0 % (ref 0.0–0.2)

## 2024-02-16 NOTE — ED Notes (Signed)
 PCP appt set. Pt informed he has been informed.

## 2024-02-16 NOTE — ED Provider Notes (Signed)
 [facility]    CSN: [id number] Arrival date & time: 02/16/24  1004      History   Chief Complaint Chief Complaint  Patient presents with   finger problem    HPI Kyle Doyle is a 33 y.o. male.   Kyle Doyle is a 33 y.o. male presenting for chief complaint of left finger injury that happened 2.5 months ago.  He was lowering his garage door manually and accidentally jammed his finger underneath the garage door causing severe pain and swelling to the DIP joint.  This is the first time that he has sought care for this injury.  He is having pain, swelling, and redness at the DIP joint of the left fifth digit.  States pain has been persistent over the last 2.5 months.  Denies previous injuries to the affected digit, numbness and tingling to the distal tip of the left finger, and open wounds to the skin.  Denies fever, chills, warmth to the area of redness over the swelling to the DIP joint of the finger, and damage to the left pinky fingernail.  He is a type II diabetic with a history of high blood pressure and has been without his daily medications for the last 3 to 4 months because he has a lot going on in his personal life with a lot of stress. He recently moved here from [place]  and has not had the chance to transfer his care to a PCP in the area yet. He was previously on lisinopril and metformin  but cannot remember his dosing.  He additionally was on Ozempic but states this made his stomach hurt and made him nauseous so he quit taking it months ago.   He has had polydipsia and polyuria for the last few months without dysuria, urinary urgency, urinary hesitancy, fever/chills, nausea, vomiting, diarrhea, or abdominal pain.  He checked his blood sugar at home a few days ago inbetween meals and it was 270.   BP elevated at 179/132.  Long history of hypertension. He has been without his medication (lisinopril) as stated above for the last 3 to 4 months. Denies CP, SOB,  palpitations, dizziness, extremity weakness, headache, vision changes, and paresthesias.      Past Medical History:  Diagnosis Date   Asthma    Hypertension     Patient Active Problem List   Diagnosis Date Noted   Type 2 diabetes mellitus with diabetic neuropathy, without long-term current use of insulin (HCC) 04/15/2018   Insomnia 04/15/2018   Numbness of left foot 04/15/2018   Acne vulgaris 03/05/2018   Elevated blood pressure reading 06/24/2017   Morbid obesity (HCC) 06/24/2017   Sleep-disordered breathing 06/24/2017   Chronic pain of both knees 06/24/2017   Allergic rhinitis 01/02/2009    Past Surgical History:  Procedure Laterality Date   ORIF TIBIA & FIBULA FRACTURES     SHOULDER ARTHROSCOPY         Home Medications    Prior to Admission medications   Medication Sig Start Date End Date Taking? Authorizing Provider  ipratropium (ATROVENT ) 0.06 % nasal spray Place 2 sprays into both nostrils 4 (four) times daily. 04/15/18   Kennyth Worth HERO, MD  methocarbamol  (ROBAXIN ) 500 MG tablet Take 1 tablet (500 mg total) by mouth 2 (two) times daily. Patient not taking: Reported on 06/30/2019 06/07/19   Layden, Lindsey A, PA-C  metFORMIN  (GLUCOPHAGE  XR) 750 MG 24 hr tablet Take 1 tablet (750 mg total) by mouth daily with breakfast. Patient  not taking: Reported on 06/30/2019 04/15/18 06/30/19  Kennyth Worth HERO, MD    Family History History reviewed. No pertinent family history.  Social History Social History   Tobacco Use   Smoking status: Never   Smokeless tobacco: Never  Substance Use Topics   Alcohol use: Yes    Comment: occasional   Drug use: No     Allergies   Peanut-containing drug products   Review of Systems Review of Systems Per HPI  Physical Exam Triage Vital Signs ED Triage Vitals  Encounter Vitals Group     BP 02/16/24 1048 (!) 179/132     Girls Systolic BP Percentile --      Girls Diastolic BP Percentile --      Boys Systolic BP Percentile --       Boys Diastolic BP Percentile --      Pulse Rate 02/16/24 1048 85     Resp 02/16/24 1048 20     Temp 02/16/24 1048 98.9 F (37.2 C)     Temp src --      SpO2 02/16/24 1048 96 %     Weight --      Height --      Head Circumference --      Peak Flow --      Pain Score 02/16/24 1046 6     Pain Loc --      Pain Education --      Exclude from Growth Chart --    No data found.  Updated Vital Signs BP (!) 179/132   Pulse 85   Temp 98.9 F (37.2 C)   Resp 20   SpO2 96%   Visual Acuity Right Eye Distance:   Left Eye Distance:   Bilateral Distance:    Right Eye Near:   Left Eye Near:    Bilateral Near:     Physical Exam Vitals and nursing note reviewed.  Constitutional:      Appearance: He is not ill-appearing or toxic-appearing.  HENT:     Head: Normocephalic and atraumatic.     Right Ear: Hearing and external ear normal.     Left Ear: Hearing and external ear normal.     Nose: Nose normal.     Mouth/Throat:     Lips: Pink.     Mouth: Mucous membranes are moist.  Eyes:     General: Lids are normal. Vision grossly intact. Gaze aligned appropriately.     Extraocular Movements: Extraocular movements intact.     Conjunctiva/sclera: Conjunctivae normal.  Cardiovascular:     Rate and Rhythm: Normal rate and regular rhythm.     Heart sounds: Normal heart sounds, S1 normal and S2 normal.  Pulmonary:     Effort: Pulmonary effort is normal. No respiratory distress.     Breath sounds: Normal breath sounds and air entry.  Musculoskeletal:     Right hand: Normal.     Left hand: Swelling, tenderness and bony tenderness present. No deformity or lacerations. Decreased range of motion. Normal strength. Normal sensation. There is no disruption of two-point discrimination. Normal capillary refill. Normal pulse (+2 left radial pulse).     Cervical back: Neck supple.     Comments: Left little finger: Non-fluctuant swelling and erythema over the dorsal DIP joint of the left little  finger, no warmth to palpation, very tender. Sensation intact distally. Nail intact. Less than 2 cap refill.   Skin:    General: Skin is warm and dry.     Capillary  Refill: Capillary refill takes less than 2 seconds.     Findings: No rash.  Neurological:     General: No focal deficit present.     Mental Status: He is alert and oriented to person, place, and time. Mental status is at baseline.     GCS: GCS eye subscore is 4. GCS verbal subscore is 5. GCS motor subscore is 6.     Cranial Nerves: Cranial nerves 2-12 are intact. No dysarthria or facial asymmetry.     Sensory: Sensation is intact.     Motor: Motor function is intact. No weakness, tremor, abnormal muscle tone or pronator drift.     Coordination: Coordination is intact. Romberg sign negative. Coordination normal. Finger-Nose-Finger Test normal.     Gait: Gait is intact.     Comments: Strength and sensation intact to bilateral upper and lower extremities (5/5). Moves all 4 extremities with normal coordination voluntarily. Non-focal neuro exam.   Psychiatric:        Mood and Affect: Mood normal.        Speech: Speech normal.        Behavior: Behavior normal.        Thought Content: Thought content normal.        Judgment: Judgment normal.      UC Treatments / Results  Labs (all labs ordered are listed, but only abnormal results are displayed) Labs Reviewed  POCT URINALYSIS DIP (MANUAL ENTRY) - Abnormal; Notable for the following components:      Result Value   Glucose, UA >=1,000 (*)    Ketones, POC UA trace (5) (*)    Protein Ur, POC =30 (*)    All other components within normal limits  CBC  HEMOGLOBIN A1C  COMPREHENSIVE METABOLIC PANEL WITH GFR    EKG   Radiology DG Finger Little Left Result Date: 02/16/2024 CLINICAL DATA:  Crush injury to fifth finger from garage door. EXAM: LEFT FINGER(S) - 2+ VIEW COMPARISON:  None Available. FINDINGS: Mild soft tissue swelling over the region of the fifth DIP joint. Subtle  cortical irregularity along the ulnar/volar base of the distal phalanx more typical of focal erosive change and not a fracture. Remainder the exam is unremarkable. IMPRESSION: 1. No acute fracture. 2. Subtle focal cortical irregularity along the ulnar/volar base of the distal phalanx more typical of focal erosive change and not a fracture. Infection less likely but possible. Electronically Signed   By: Toribio Agreste M.D.   On: 02/16/2024 12:31    Procedures Procedures (including critical care time)  Medications Ordered in UC Medications - No data to display  Initial Impression / Assessment and Plan / UC Course  I have reviewed the triage vital signs and the nursing notes.  Pertinent labs & imaging results that were available during my care of the patient were reviewed by me and considered in my medical decision making (see chart for details).   1.  Pain of finger of left hand Left pinky finger x-ray shows no acute fracture but rather subtle cortical irregularity along the ulnar volar base of the distal left little finger phalanx typical of erosive change per radiology reread.  There are no signs of infection to the finger on exam today, low suspicion for osteomyelitis/cellulitis.   Recommend follow-up with hand specialist for ongoing evaluation and management of finger pain and swelling.   Walking referral to Dr. Lorretta hand specialist given for follow-up.   2.  Polyuria, type 2 diabetes with hyperglycemia without long-term use of  insulin, elevated blood pressure reading in office with diagnosis of hypertension Patient has been without his chronic medications for the last 3 to 4 months resulting in uncontrolled diabetes and uncontrolled hypertension.  Glycosuria with trace ketones in urine on UA today.  No signs of UTI.  Hemoglobin A1c, CBC, and CMP are pending.  Most recent lab work I am able to see in his medical record and in care everywhere is from 2019.  I would like to wait for his  CMP to come back before prescribing metformin /lisinopril-hydrochlorothiazide to ensure his renal/hepatic function are intact.  RN was able to schedule a new patient appointment for him to establish care with a PCP in the area today.  No red flags on exam indicating need for referral to the emergency room for more complete workup.  Neurologically intact at baseline.   Will call to manage treatment further once I have blood work back.   Counseled patient on potential for adverse effects with medications prescribed/recommended today, strict ER and return-to-clinic precautions discussed, patient verbalized understanding.    Final Clinical Impressions(s) / UC Diagnoses   Final diagnoses:  Polyuria  Type 2 diabetes mellitus with hyperglycemia, without long-term current use of insulin (HCC)  Pain of finger of left hand  Elevated blood pressure reading in office with diagnosis of hypertension     Discharge Instructions      Your finger appears dislocated on x-ray and there appears to be a small fracture at the tip of your left pinky finger.  You may take Tylenol  1000 mg every 6 hours as needed for pain and swelling.  Please schedule a follow-up appointment with the hand specialist listed on your paperwork (Dr. Lorretta).  The hand specialist will be able to direct you further in terms of what needs to happen next to fix your finger since it has been 2.5 months and this will likely be difficult to put back into place.    Your blood pressure was elevated in the clinic today.  I have drawn blood work to evaluate your kidney and liver function and will prescribe medication for both your diabetes and your high blood pressure once I get your blood work back.  We need to bring your blood pressure down safely and slowly to a normal level with medicine.  Purchase a blood pressure cuff and take your BP once daily for the next 2 weeks, then 3-4 times per week after that. Write these numbers down in a  notebook and bring the notebook with you to your primary care doctor appointment.  I expect your numbers to remain elevated and slowly come down to normal.   Lower the amount of salt in your diet to less than 1 gram of salt per day and increase exercise to naturally lower BP. Review information provided regarding high blood pressure.  Schedule an appointment with primary care provider for ongoing management of high blood pressure and for routine healthcare screenings.  Please go to the ER if you develop any severe symptoms such as chest pain, sudden shortness of breath, or one-sided weakness. I hope you feel better!       ED Prescriptions   None    PDMP not reviewed this encounter.   Enedelia Dorna HERO, [place] 02/16/24 1338

## 2024-02-16 NOTE — ED Triage Notes (Signed)
 PT reports swelling to Lt small finger for a couple of months. Pt initially slammed Lt small finger in a garage door about 21/2 months ago.  Pt reports condition has been getting worse. Pt has limited movement to Lt finger.

## 2024-02-16 NOTE — Discharge Instructions (Addendum)
 Your finger appears dislocated on x-ray and there appears to be a small fracture at the tip of your left pinky finger.  You may take Tylenol  1000 mg every 6 hours as needed for pain and swelling.  Please schedule a follow-up appointment with the hand specialist listed on your paperwork (Dr. Lorretta).  The hand specialist will be able to direct you further in terms of what needs to happen next to fix your finger since it has been 2.5 months and this will likely be difficult to put back into place.    Your blood pressure was elevated in the clinic today.  I have drawn blood work to evaluate your kidney and liver function and will prescribe medication for both your diabetes and your high blood pressure once I get your blood work back.  We need to bring your blood pressure down safely and slowly to a normal level with medicine.  Purchase a blood pressure cuff and take your BP once daily for the next 2 weeks, then 3-4 times per week after that. Write these numbers down in a notebook and bring the notebook with you to your primary care doctor appointment.  I expect your numbers to remain elevated and slowly come down to normal.   Lower the amount of salt in your diet to less than 1 gram of salt per day and increase exercise to naturally lower BP. Review information provided regarding high blood pressure.  Schedule an appointment with primary care provider for ongoing management of high blood pressure and for routine healthcare screenings.  Please go to the ER if you develop any severe symptoms such as chest pain, sudden shortness of breath, or one-sided weakness. I hope you feel better!

## 2024-02-16 NOTE — ED Notes (Addendum)
 PT reports all his Meds come from a [facility] in [place]. Pt has not attempted to transfer His Meds to a [facility] in [place].  Pt has not had any BP meds for Months.

## 2024-02-17 ENCOUNTER — Ambulatory Visit (HOSPITAL_COMMUNITY): Payer: Self-pay | Admitting: Internal Medicine

## 2024-02-17 ENCOUNTER — Ambulatory Visit (HOSPITAL_BASED_OUTPATIENT_CLINIC_OR_DEPARTMENT_OTHER): Admitting: Family Medicine

## 2024-02-17 MED ORDER — LISINOPRIL-HYDROCHLOROTHIAZIDE 10-12.5 MG PO TABS: 1.0000 | ORAL_TABLET | Freq: Every day | ORAL | 0 refills | Status: DC

## 2024-02-17 MED ORDER — METFORMIN HCL 500 MG PO TABS: ORAL_TABLET | ORAL | 0 refills | Status: DC

## 2024-02-17 NOTE — Telephone Encounter (Signed)
 Opened in error

## 2024-03-02 ENCOUNTER — Other Ambulatory Visit (HOSPITAL_BASED_OUTPATIENT_CLINIC_OR_DEPARTMENT_OTHER): Payer: Self-pay

## 2024-03-02 ENCOUNTER — Ambulatory Visit (INDEPENDENT_AMBULATORY_CARE_PROVIDER_SITE_OTHER): Admitting: Family Medicine

## 2024-03-02 ENCOUNTER — Encounter (HOSPITAL_BASED_OUTPATIENT_CLINIC_OR_DEPARTMENT_OTHER): Payer: Self-pay | Admitting: Family Medicine

## 2024-03-02 ENCOUNTER — Encounter (HOSPITAL_BASED_OUTPATIENT_CLINIC_OR_DEPARTMENT_OTHER): Payer: Self-pay

## 2024-03-02 VITALS — BP 182/127 | HR 94 | Temp 98.8°F | Resp 18 | Ht 75.0 in | Wt 347.0 lb

## 2024-03-02 DIAGNOSIS — E559 Vitamin D deficiency, unspecified: Secondary | ICD-10-CM | POA: Insufficient documentation

## 2024-03-02 DIAGNOSIS — I10 Essential (primary) hypertension: Secondary | ICD-10-CM

## 2024-03-02 DIAGNOSIS — N529 Male erectile dysfunction, unspecified: Secondary | ICD-10-CM | POA: Insufficient documentation

## 2024-03-02 DIAGNOSIS — M79645 Pain in left finger(s): Secondary | ICD-10-CM | POA: Insufficient documentation

## 2024-03-02 DIAGNOSIS — K219 Gastro-esophageal reflux disease without esophagitis: Secondary | ICD-10-CM

## 2024-03-02 DIAGNOSIS — E1121 Type 2 diabetes mellitus with diabetic nephropathy: Secondary | ICD-10-CM | POA: Insufficient documentation

## 2024-03-02 DIAGNOSIS — E1165 Type 2 diabetes mellitus with hyperglycemia: Secondary | ICD-10-CM | POA: Diagnosis not present

## 2024-03-02 DIAGNOSIS — R7989 Other specified abnormal findings of blood chemistry: Secondary | ICD-10-CM | POA: Insufficient documentation

## 2024-03-02 MED ORDER — AMLODIPINE BESYLATE 5 MG PO TABS
5.0000 mg | ORAL_TABLET | Freq: Every day | ORAL | 1 refills | Status: DC
  Filled 2024-03-02: qty 90, 90d supply, fill #0

## 2024-03-02 MED ORDER — TIRZEPATIDE 2.5 MG/0.5ML ~~LOC~~ SOAJ
2.5000 mg | SUBCUTANEOUS | 1 refills | Status: DC
  Filled 2024-03-02: qty 2, 28d supply, fill #0

## 2024-03-02 MED ORDER — LISINOPRIL-HYDROCHLOROTHIAZIDE 10-12.5 MG PO TABS
1.0000 | ORAL_TABLET | Freq: Every day | ORAL | 1 refills | Status: DC
  Filled 2024-03-02: qty 90, 90d supply, fill #0

## 2024-03-02 MED ORDER — OMEPRAZOLE 40 MG PO CPDR
40.0000 mg | DELAYED_RELEASE_CAPSULE | Freq: Every day | ORAL | 3 refills | Status: DC
  Filled 2024-03-02: qty 90, 90d supply, fill #0

## 2024-03-02 NOTE — Assessment & Plan Note (Signed)
 Blood pressure elevated in office today.  We can proceed with medication adjustments at this time.  Can continue with lisinopril-hydrochlorothiazide at current dose.  Will add amlodipine at low-dose.  Cautioned on potential side effects. Recommend intermittent monitoring of blood pressure at home, DASH diet If blood pressure continues to be elevated at follow-up visit, likely would adjust dose of lisinopril-hydrochlorothiazide as a next step

## 2024-03-02 NOTE — Progress Notes (Signed)
 New Patient Office Visit  Subjective   Patient ID: Kyle Doyle, male    DOB: 01/24/1991  Age: 33 y.o. MRN: [id number]  CC:  Chief Complaint  Patient presents with   Establish Care    HPI Kyle Doyle presents to establish care Last PCP - in [place]   Left little finger pain. Had eval at [facility], recommend ortho evaluation, but provider was not in network. Wanting new referral.  DM: was on Ozempic, but was having some GI side effects, recommend trying Mounjaro possibly. Currently taking metformin . Last A1c was a few weeks ago and was elevated at 10.4%.  HTN: taking lisinopril and amlodipine. Does check BP at home. Home readings elevated.  Patient is originally from [place] area. Has been moving in the past few years. He works at [employer]. Will be switching to [employer]. He enjoys fishing, walking, football.  Outpatient Encounter Medications as of 03/02/2024  Medication Sig   amLODipine (NORVASC) 5 MG tablet Take 1 tablet (5 mg total) by mouth daily.   Lancets MISC Inject into the skin.   metFORMIN  (GLUCOPHAGE ) 500 MG tablet Take 1 tablet (500 mg total) by mouth 2 (two) times daily with a meal for 7 days, THEN 2 tablets (1,000 mg total) 2 (two) times daily with a meal for 21 days.   tirzepatide (MOUNJARO) 2.5 MG/0.5ML Pen Inject 2.5 mg into the skin once a week.   [DISCONTINUED] lisinopril-hydrochlorothiazide (ZESTORETIC) 10-12.5 MG tablet Take 1 tablet by mouth daily.   [DISCONTINUED] omeprazole (PRILOSEC) 40 MG capsule Take 40 mg by mouth daily.   empagliflozin (JARDIANCE) 25 MG TABS tablet Take 25 mg by mouth daily. (Patient not taking: Reported on 03/02/2024)   ipratropium (ATROVENT ) 0.06 % nasal spray Place 2 sprays into both nostrils 4 (four) times daily. (Patient not taking: Reported on 03/02/2024)   lisinopril-hydrochlorothiazide (ZESTORETIC) 10-12.5 MG tablet Take 1 tablet by mouth daily.   Naftifine HCl (NAFTIN) 2 % GEL Apply 1 Application topically 2  (two) times daily.   omeprazole (PRILOSEC) 40 MG capsule Take 1 capsule (40 mg total) by mouth daily.   testosterone (TESTIM) 50 MG/5GM (1%) GEL Apply 50 mg topically daily.   [DISCONTINUED] methocarbamol  (ROBAXIN ) 500 MG tablet Take 1 tablet (500 mg total) by mouth 2 (two) times daily. (Patient not taking: Reported on 06/30/2019)   [DISCONTINUED] ondansetron  (ZOFRAN ) 8 MG tablet Take 8 mg by mouth every 8 (eight) hours as needed for nausea.   [DISCONTINUED] Semaglutide, 2 MG/DOSE, (OZEMPIC, 2 MG/DOSE,) 8 MG/3ML SOPN Inject 2 mg into the skin every 7 (seven) days.   [DISCONTINUED] telmisartan-hydrochlorothiazide (MICARDIS HCT) 80-25 MG tablet Take 1 tablet by mouth daily.   No facility-administered encounter medications on file as of 03/02/2024.    Past Medical History:  Diagnosis Date   Asthma    Hypertension     Past Surgical History:  Procedure Laterality Date   ORIF TIBIA & FIBULA FRACTURES     SHOULDER ARTHROSCOPY      History reviewed. No pertinent family history.  Social History   Socioeconomic History   Marital status: Single    Spouse name: Not on file   Number of children: Not on file   Years of education: Not on file   Highest education level: Not on file  Occupational History   Not on file  Tobacco Use   Smoking status: Never    Passive exposure: Never   Smokeless tobacco: Never  Substance and Sexual Activity   Alcohol  use: Yes    Comment: occasional   Drug use: No   Sexual activity: Not on file  Other Topics Concern   Not on file  Social History Narrative   Not on file   Social Drivers of Health   Financial Resource Strain: Not on file  Food Insecurity: Not on file  Transportation Needs: Not on file  Physical Activity: Not on file  Stress: Not on file  Social Connections: Not on file  Intimate Partner Violence: Not on file    Objective   BP (!) 182/127 (BP Location: Right Arm, Patient Position: Sitting, Cuff Size: Large)   Pulse 94   Temp 98.8  F (37.1 C) (Oral)   Resp 18   Ht 6' 3 (1.905 m)   Wt (!) 347 lb (157.4 kg)   SpO2 96%   BMI 43.37 kg/m   Physical Exam  33 year old male in no acute distress Cardiovascular exam with regular rate and rhythm Lungs clear to auscultation bilaterally  Assessment & Plan:   Type 2 diabetes mellitus with hyperglycemia, without long-term current use of insulin (HCC) Assessment & Plan: Most recent A1c a few weeks ago showed that this was not controlled as it was greater than 10% of the time.  We discussed considerations, can continue with metformin  at this time.  We can also look to start with GLP-1 receptor agonist.  He did not tolerate Ozempic in the past, uncertain if he had other injectables such as Trulicity. His preference is to proceed with Mounjaro, uncertain of insurance coverage.  We can proceed with looking to obtain authorization for Mounjaro.  Discussed that alternative such as Trulicity may be preferred.  He would be open to Trulicity if this is the required next step. Plan to complete foot exam at future visit We will check urine ACR at future visit  Orders: -     Tirzepatide; Inject 2.5 mg into the skin once a week.  Dispense: 2 mL; Refill: 1  Gastroesophageal reflux disease without esophagitis -     Omeprazole; Take 1 capsule (40 mg total) by mouth daily.  Dispense: 90 capsule; Refill: 3  Essential hypertension Assessment & Plan: Blood pressure elevated in office today.  We can proceed with medication adjustments at this time.  Can continue with lisinopril-hydrochlorothiazide at current dose.  Will add amlodipine at low-dose.  Cautioned on potential side effects. Recommend intermittent monitoring of blood pressure at home, DASH diet If blood pressure continues to be elevated at follow-up visit, likely would adjust dose of lisinopril-hydrochlorothiazide as a next step   Finger pain, left Assessment & Plan: Did have evaluation in urgent care, had imaging obtained which  showed erosive changes on imaging.  He had difficulty arranging for orthopedic evaluation as prior referral was to provider who was not in network We can proceed with new referral to orthopedic specialist today for further evaluation given symptoms and changes noted on x-ray  Orders: -     Ambulatory referral to Orthopedic Surgery  Other orders -     amLODIPine Besylate; Take 1 tablet (5 mg total) by mouth daily.  Dispense: 90 tablet; Refill: 1 -     Lisinopril-hydroCHLOROthiazide; Take 1 tablet by mouth daily.  Dispense: 90 tablet; Refill: 1  Return in about 6 weeks (around 04/13/2024) for hypertension, diabetes, med check, 40 minutes.    ___________________________________________ Shariece Viveiros de [place], MD, ABFM, CAQSM Primary Care and Sports Medicine [facility]

## 2024-03-02 NOTE — Assessment & Plan Note (Signed)
 Most recent A1c a few weeks ago showed that this was not controlled as it was greater than 10% of the time.  We discussed considerations, can continue with metformin  at this time.  We can also look to start with GLP-1 receptor agonist.  He did not tolerate Ozempic in the past, uncertain if he had other injectables such as Trulicity. His preference is to proceed with Mounjaro, uncertain of insurance coverage.  We can proceed with looking to obtain authorization for Mounjaro.  Discussed that alternative such as Trulicity may be preferred.  He would be open to Trulicity if this is the required next step. Plan to complete foot exam at future visit We will check urine ACR at future visit

## 2024-03-02 NOTE — Assessment & Plan Note (Signed)
 Did have evaluation in urgent care, had imaging obtained which showed erosive changes on imaging.  He had difficulty arranging for orthopedic evaluation as prior referral was to provider who was not in network We can proceed with new referral to orthopedic specialist today for further evaluation given symptoms and changes noted on x-ray

## 2024-03-10 ENCOUNTER — Other Ambulatory Visit: Payer: Self-pay

## 2024-03-10 ENCOUNTER — Emergency Department (HOSPITAL_COMMUNITY)

## 2024-03-10 ENCOUNTER — Emergency Department (HOSPITAL_COMMUNITY)
Admission: EM | Admit: 2024-03-10 | Discharge: 2024-03-11 | Disposition: A | Discharge status: Home / Self Care | Attending: Emergency Medicine | Admitting: Emergency Medicine

## 2024-03-10 ENCOUNTER — Encounter (HOSPITAL_COMMUNITY): Payer: Self-pay

## 2024-03-10 DIAGNOSIS — J45909 Unspecified asthma, uncomplicated: Secondary | ICD-10-CM | POA: Diagnosis not present

## 2024-03-10 DIAGNOSIS — R2231 Localized swelling, mass and lump, right upper limb: Secondary | ICD-10-CM | POA: Insufficient documentation

## 2024-03-10 DIAGNOSIS — M79641 Pain in right hand: Secondary | ICD-10-CM | POA: Diagnosis present

## 2024-03-10 DIAGNOSIS — Z79899 Other long term (current) drug therapy: Secondary | ICD-10-CM | POA: Diagnosis not present

## 2024-03-10 DIAGNOSIS — I10 Essential (primary) hypertension: Secondary | ICD-10-CM | POA: Insufficient documentation

## 2024-03-10 DIAGNOSIS — E1165 Type 2 diabetes mellitus with hyperglycemia: Secondary | ICD-10-CM | POA: Insufficient documentation

## 2024-03-10 DIAGNOSIS — Z7984 Long term (current) use of oral hypoglycemic drugs: Secondary | ICD-10-CM | POA: Diagnosis not present

## 2024-03-10 DIAGNOSIS — Z91010 Allergy to peanuts: Secondary | ICD-10-CM | POA: Diagnosis not present

## 2024-03-10 LAB — CBC WITH DIFFERENTIAL/PLATELET
Abs Immature Granulocytes: 0.03 K/uL (ref 0.00–0.07)
Basophils Absolute: 0.1 K/uL (ref 0.0–0.1)
Basophils Relative: 1 %
Eosinophils Absolute: 0.1 K/uL (ref 0.0–0.5)
Eosinophils Relative: 1 %
HCT: 44.3 % (ref 39.0–52.0)
Hemoglobin: 15.3 g/dL (ref 13.0–17.0)
Immature Granulocytes: 0 %
Lymphocytes Relative: 24 %
Lymphs Abs: 2.4 K/uL (ref 0.7–4.0)
MCH: 29 pg (ref 26.0–34.0)
MCHC: 34.5 g/dL (ref 30.0–36.0)
MCV: 84.1 fL (ref 80.0–100.0)
Monocytes Absolute: 0.8 K/uL (ref 0.1–1.0)
Monocytes Relative: 8 %
Neutro Abs: 6.6 K/uL (ref 1.7–7.7)
Neutrophils Relative %: 66 %
Platelets: 326 K/uL (ref 150–400)
RBC: 5.27 MIL/uL (ref 4.22–5.81)
RDW: 12.5 % (ref 11.5–15.5)
WBC: 10 K/uL (ref 4.0–10.5)
nRBC: 0 % (ref 0.0–0.2)

## 2024-03-10 LAB — COMPREHENSIVE METABOLIC PANEL WITH GFR
ALT: 18 U/L (ref 0–44)
AST: 24 U/L (ref 15–41)
Albumin: 3.9 g/dL (ref 3.5–5.0)
Alkaline Phosphatase: 85 U/L (ref 38–126)
Anion gap: 15 (ref 5–15)
BUN: 13 mg/dL (ref 6–20)
CO2: 23 mmol/L (ref 22–32)
Calcium: 9.5 mg/dL (ref 8.9–10.3)
Chloride: 99 mmol/L (ref 98–111)
Creatinine, Ser: 1.01 mg/dL (ref 0.61–1.24)
GFR, Estimated: >60 mL/min (ref >60)
Glucose, Bld: 352 mg/dL — ABNORMAL HIGH (ref 70–99)
Potassium: 4.1 mmol/L (ref 3.5–5.1)
Sodium: 137 mmol/L (ref 135–145)
Total Bilirubin: 0.8 mg/dL (ref 0.0–1.2)
Total Protein: 8 g/dL (ref 6.5–8.1)

## 2024-03-10 LAB — URIC ACID: Uric Acid, Serum: 4 mg/dL (ref 3.7–8.6)

## 2024-03-10 LAB — C-REACTIVE PROTEIN: CRP: 3 mg/dL — ABNORMAL HIGH (ref <1.0)

## 2024-03-10 LAB — SEDIMENTATION RATE: Sed Rate: 13 mm/h (ref 0–16)

## 2024-03-10 MED ORDER — ONDANSETRON 4 MG PO TBDP
4.0000 mg | ORAL_TABLET | Freq: Once | ORAL | Status: AC
  Administered 2024-03-10: 4 mg via ORAL
  Filled 2024-03-10: qty 1

## 2024-03-10 MED ORDER — OXYCODONE-ACETAMINOPHEN 5-325 MG PO TABS
2.0000 | ORAL_TABLET | Freq: Once | ORAL | Status: AC
  Administered 2024-03-10: 2 via ORAL
  Filled 2024-03-10: qty 2

## 2024-03-10 NOTE — ED Notes (Signed)
 No response after an hour

## 2024-03-10 NOTE — ED Triage Notes (Signed)
 Patient states that he woke up and is right hand was swollen and he is unable to move his fingers and unable to make a fist. No injury and no insect bite near the hand. States he thinks he got bit on the abdomen by a spider.

## 2024-03-10 NOTE — ED Provider Triage Note (Signed)
 Emergency Medicine Provider Triage Evaluation Note  Kyle Doyle , a 33 y.o. male  was evaluated in triage.  Pt complains of right hand swelling.  Several days.  Severe pain 10 out of 10 unable to sleep no known injury no history of the same.  Review of Systems  Positive: Right hand swelling and pain Negative: Fever  Physical Exam  BP (!) 178/110   Pulse 99   Temp 99.4 F (37.4 C) (Oral)   Resp 16   Ht 6' 3 (1.905 m)   Wt (!) 149.7 kg   SpO2 97%   BMI 41.25 kg/m  Gen:   Awake, no distress   Resp:  Normal effort  MSK:   Moves extremities without difficulty  Other:  Fuhs erythematous tender swelling concentrated over the dorsal surface of the right middle finger extending up to the mid forearm.  Medical Decision Making  Medically screening exam initiated at 5:24 PM.  Appropriate orders placed.  Kyle Doyle was informed that the remainder of the evaluation will be completed by another provider, this initial triage assessment does not replace that evaluation, and the importance of remaining in the ED until their evaluation is complete.     Kyle Chroman, PA-C 03/10/24 1726

## 2024-03-11 MED ORDER — AMLODIPINE BESYLATE 5 MG PO TABS
5.0000 mg | ORAL_TABLET | Freq: Once | ORAL | Status: AC
  Administered 2024-03-11: 5 mg via ORAL
  Filled 2024-03-11: qty 1

## 2024-03-11 MED ORDER — KETOROLAC TROMETHAMINE 60 MG/2ML IM SOLN
30.0000 mg | Freq: Once | INTRAMUSCULAR | Status: AC
  Administered 2024-03-11: 30 mg via INTRAMUSCULAR
  Filled 2024-03-11: qty 2

## 2024-03-11 MED ORDER — NAPROXEN 500 MG PO TABS: 500.0000 mg | ORAL_TABLET | Freq: Two times a day (BID) | ORAL | 0 refills | Status: AC

## 2024-03-11 NOTE — ED Provider Notes (Signed)
 [place] [facility]ERGENCY DE[facility]MENT AT [facility] Provider Note  CSN: [id number] Arrival date & time: 03/10/24 1433  Chief Complaint(s) RT hand swollen  HPI Kyle Doyle is a 33 y.o. male with a past medical history listed below who presents to the emergency department with several days of right hand pain and swelling.  He was evaluated in [place]  on the 10th of this month for the same and treated for possible cellulitis with doxycycline.  He denies any associated redness of the finger.  He denies any trauma.  No break in the skin.  Presented here for worsening pain.  Denies any prior history of gout flares.  He does have a history of hypertension and is on lisinopril/HCTZ.  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   Vitamin D deficiency 03/02/2024   Finger pain, left 03/02/2024   Callus 10/23/2023   Pes planus 10/23/2023   Hyperlipidemia 01/22/2021   Type 2 diabetes mellitus with diabetic neuropathy, without long-term current use of insulin (HCC) 04/15/2018   Insomnia 04/15/2018   Numbness of left foot 04/15/2018   Type 2 diabetes mellitus (HCC) 04/15/2018   Acne vulgaris 03/05/2018   Elevated blood pressure reading 06/24/2017   Morbid obesity (HCC) 06/24/2017   Sleep-disordered breathing 06/24/2017   Chronic pain of both knees 06/24/2017   Slow transit constipation 12/16/2014   BMI 40.0-44.9, adult (HCC) 12/15/2014   SVT (supraventricular tachycardia) 12/13/2013   Essential hypertension 12/11/2013   Syncope, near 12/11/2013   Sleep apnea 11/08/2012   Allergic rhinitis 01/02/2009   Home Medication(s) Prior to Admission medications   Medication Sig Start Date End Date Taking? Authorizing Provider  naproxen  (NAPROSYN) 500 MG tablet Take 1 tablet (500 mg total) by mouth 2 (two) times daily for 7 days. 03/11/24 03/18/24 Yes Janel Beane, Raynell Moder, MD  amLODipine (NORVASC) 5 MG tablet Take 1 tablet (5 mg total) by mouth daily. 03/02/24   de [place], Raymond J, MD  empagliflozin (JARDIANCE) 25 MG TABS tablet Take 25 mg by mouth daily. Patient not taking: Reported on 03/02/2024 07/31/23   [provider]  ipratropium (ATROVENT ) 0.06 % nasal spray Place 2 sprays into both nostrils 4 (four) times daily. Patient not taking: Reported on 03/02/2024 04/15/18   Kennyth Worth HERO, MD  Lancets MISC Inject into the skin. 06/12/23   [provider]  lisinopril-hydrochlorothiazide (ZESTORETIC) 10-12.5 MG tablet Take 1 tablet by mouth daily. 03/02/24   de [place], Quintin PARAS, MD  metFORMIN  (GLUCOPHAGE ) 500 MG tablet Take 1 tablet (500 mg total) by mouth 2 (two) times daily with a meal for 7 days, THEN 2 tablets (1,000 mg total) 2 (two) times daily with a meal for 21 days. 02/17/24 03/16/24  Enedelia Dorna HERO, FNP  Naftifine HCl (NAFTIN) 2 % GEL Apply 1 Application topically 2 (two) times daily. 07/10/23   [provider]  omeprazole (PRILOSEC) 40 MG capsule Take 1 capsule (40 mg total) by mouth daily. 03/02/24   de [place], Raymond J, MD  testosterone (TESTIM) 50 MG/5GM (1%) GEL Apply 50 mg topically daily. 08/26/23   [provider]  tirzepatide CLOYDE) 2.5 MG/0.5ML Pen Inject 2.5 mg into the skin once a week. 03/02/24   de [place], Raymond J, MD  Allergies Peanut-containing drug products and Influenza vaccines  Review of Systems Review of Systems As noted in HPI  Physical Exam Vital Signs  I have reviewed the triage vital signs BP (!) 149/90   Pulse 76   Temp 98.6 F (37 C) (Oral)   Resp 19   Ht 6' 3 (1.905 m)   Wt (!) 149.7 kg   SpO2 98%   BMI 41.25 kg/m    Physical Exam Vitals reviewed.  Constitutional:      General: He is not in acute distress.    Appearance: He is well-developed. He is obese. He is not diaphoretic.  HENT:     Head: Normocephalic and atraumatic.     Right Ear: External ear normal.     Left Ear: External ear normal.     Nose: Nose normal.     Mouth/Throat:     Mouth: Mucous membranes are moist.  Eyes:     General: No scleral icterus.    Conjunctiva/sclera: Conjunctivae normal.  Neck:     Trachea: Phonation normal.  Cardiovascular:     Rate and Rhythm: Normal rate and regular rhythm.  Pulmonary:     Effort: Pulmonary effort is normal. No respiratory distress.     Breath sounds: No stridor.  Abdominal:     General: There is no distension.  Musculoskeletal:     Right hand: Swelling, tenderness and bony tenderness present. Decreased range of motion. Normal strength. Normal sensation. Normal pulse.     Left hand: Normal strength. Normal sensation. Normal pulse.     Cervical back: Normal range of motion.  Neurological:     Mental Status: He is alert and oriented to person, place, and time.  Psychiatric:        Behavior: Behavior normal.     ED Results and Treatments Labs (all labs ordered are listed, but only abnormal results are displayed) Labs Reviewed  COMPREHENSIVE METABOLIC PANEL WITH GFR - Abnormal; Notable for the following components:      Result Value   Glucose, Bld 352 (*)    All other components within normal limits  C-REACTIVE PROTEIN - Abnormal; Notable for the following components:   CRP 3.0 (*)    All other components within normal limits  CBC WITH DIFFERENTIAL/PLATELET  URIC ACID  SEDIMENTATION RATE                                                                                                                         EKG  EKG Interpretation Date/Time:    Ventricular Rate:    PR Interval:    QRS Duration:    QT Interval:    QTC Calculation:   R Axis:      Text Interpretation:          Radiology DG Hand Complete Right Result Date: 03/10/2024 EXAM: 3 OR MORE VIEW(S) XRAY OF THE HAND 03/10/2024 06:04:00 PM COMPARISON: 02/20/2006 CLINICAL HISTORY: hand pain and swelling FINDINGS: BONES AND JOINTS: No acute fracture.  Well-corticated ossific fragment along the ulnar styloid, consistent with a remote fracture. No focal osseous lesion. No joint dislocation. SOFT TISSUES: Moderate soft tissue swelling about the dorsum of the hand. IMPRESSION: 1. Moderate soft tissue swelling about the dorsum of the hand. Otherwise, no acute fracture or dislocation. Electronically signed by: Rogelia Myers MD 03/10/2024 06:15 PM EST RP Workstation: [id number]    Medications Ordered in ED Medications  oxyCODONE -acetaminophen  (PERCOCET/ROXICET) 5-325 MG per tablet 2 tablet (2 tablets Oral Given 03/10/24 1744)  ondansetron  (ZOFRAN -ODT) disintegrating tablet 4 mg (4 mg Oral Given 03/10/24 1744)  ketorolac  (TORADOL ) injection 30 mg (30 mg Intramuscular Given 03/11/24 0047)  amLODipine (NORVASC) tablet 5 mg (5 mg Oral Given 03/11/24 0046)   Procedures Procedures  (including critical care time) Medical Decision Making / ED Course   Medical Decision Making Amount and/or Complexity of Data Reviewed Labs: ordered. Decision-making details documented in ED Course. Radiology: ordered and independent interpretation performed. Decision-making details documented in ED Course.  Risk Prescription drug management.    Right hand pain and swelling.  No erythema concerning for infection.  Doubt tenosynovitis.  Favoring inflammatory arthritis, likely gout.  CBC without leukocytosis or anemia.  CMP without significant electrolyte derangements.  Hyperglycemia without DKA.  No renal insufficiency.  No bili obstruction. CRP slightly elevated.  Sed rate normal.  X-ray of the hand and negative for any acute fractures.  Patient is already prescribed doxycycline from the prior ER visit.  She is also prescribed  oxycodone . Patient was also prescribed amlodipine due to elevated blood pressures by new PCP earlier in the month.  He has not picked this medicine up.  Given a dose here which improved the blood pressure.  Patient also given IM Toradol  for pain. Will give Naproxen prescription.  He reports that he already has a follow-up appointment with hand surgery for a different issue.  Recommend he discuss his symptoms with the hand surgeons during that visit as well    Final Clinical Impression(s) / ED Diagnoses Final diagnoses:  Pain of right hand   The patient appears reasonably screened and/or stabilized for discharge and I doubt any other medical condition or other [facility] requiring further screening, evaluation, or treatment in the ED at this time. I have discussed the findings, Dx and Tx plan with the patient/family who expressed understanding and agree(s) with the plan. Discharge instructions discussed at length. The patient/family was given strict return precautions who verbalized understanding of the instructions. No further questions at time of discharge.  Disposition: Discharge  Condition: Good  ED Discharge Orders          Ordered    naproxen (NAPROSYN) 500 MG tablet  2 times daily        03/11/24 0138              Follow Up: de [place], Raymond J, MD [address] [place] [address] [phone]  Call  to schedule an appointment for close follow up    This chart was dictated using voice recognition software.  Despite best efforts to proofread,  errors can occur which can change the documentation meaning.    Trine Raynell Moder, MD 03/11/24 [phone]

## 2024-03-20 NOTE — Progress Notes (Unsigned)
 Kyle Doyle - 33 y.o. male MRN [id number]  Date of birth: 06/14/1990  Office Visit Note: Visit Date: 03/21/2024 PCP: de Cuba, Raymond J, MD Referred by: de [place], Raymond J, MD  Subjective: No chief complaint on file.  HPI: Kyle Doyle is a pleasant 33 y.o. male who presents today for evaluation of the left small finger injury sustained approximate 3 months prior as well as recent right hand swelling without inciting incident.  Does mention remote history of potential gout.  For the left small finger, this was a crush injury sustained approximate 3 months prior, did not seek initial treatment.  Was seen approximately 1 month after injury, underwent clinical and radiographic workup which did not show any acute fracture.  Has had some persistent pain and swelling in this region since that time with a slightly flexed posture of the DIP.  As for the right hand, states that this was more of a recent phenomenon.  States that he woke up with his hand significantly swollen and stiff.  No inciting incident or recent injury.  Was seen in the emergency department setting, was placed on anti-inflammatory medication which helped alleviate some of his symptoms.  He is diabetic, poorly controlled with recent A1c of 10.4.  Thinks he may have a history of gout, is not currently on any prior gout medication.  Pertinent ROS were reviewed with the patient and found to be negative unless otherwise specified above in HPI.   Visit Reason: left small finger, right hand Duration of symptoms: 3 months Hand dominance: right Occupation: Scientist, Research (physical Sciences) Diabetic: Yes/ 10.4 Smoking: No Heart/Lung History: HBP Blood Thinners: none  Prior Testing/EMG: xrays Injections (Date): none Treatments: none Prior Surgery: none    Assessment & Plan: Visit Diagnoses:  1. Left hand pain   2. Pain in right hand     Plan: Extensive discussion was had with the patient today regarding his bilateral hand  complaints.  As for the left small finger, this appears to be a prior crush injury with some potential soft tissue mallet injury that is chronic in nature.  He does have a slight flexion deformity at the DIP with ongoing swelling.  We discussed chronicity of this injury, there is no acute fracture seen on x-ray workup.  From a treatment standpoint, we discussed observation, splinting and possible surgical fixation in the future should the mallet posture persist.  I explained that with the chronicity of his injury, soft tissue repair would be precluded, we would have to resort to potential terminal extensor tenotomy versus potential DIP fusion.  At this juncture, we can continue with an observational approach to allow for his swelling to improve.  Splinting as needed.  As for the right hand, this may be consistent with a gouty flare.  His swelling and stiffness have improved drastically based on his history in comparison to today's examination.  We discussed the underlying nature of gout or potential autoimmune vs inflammatory arthropathy.  I explained that there could be value in obtaining uric acid levels in the future and potential consideration of gouty medication should these flareups continue.  He stated that he is seeing his PCP sometime in the next few weeks and will discuss this at that time.  He is welcome to return to me as needed moving forward.    I spent 45 minutes in the care of this patient today including review of previous documentation, imaging obtained, face-to-face time discussing all options regarding treatment and documenting  the encounter.   Follow-up: No follow-ups on file.   Meds & Orders: No orders of the defined types were placed in this encounter.   Orders Placed This Encounter  Procedures   XR Finger Little Left   Ambulatory referral to Physical Medicine Rehab     Procedures: No procedures performed      Clinical History: No specialty comments available.  He  reports that he has never smoked. He has never been exposed to tobacco smoke. He has never used smokeless tobacco.  Recent Labs    02/16/24 1138 03/10/24 1742  HGBA1C 10.4*  --   LABURIC  --  4.0    Objective:   Vital Signs: There were no vitals taken for this visit.  Physical Exam  Gen: Well-appearing, in no acute distress; non-toxic CV: Regular Rate. Well-perfused. Warm.  Resp: Breathing unlabored on room air; no wheezing. Psych: Fluid speech in conversation; appropriate affect; normal thought process  Ortho Exam Left hand: Small finger - Slightly fixed flexion posture DIP with notable swelling, tenderness at the DIP region, unable to perform full active extension at the DIP, PIP range of motion is well-preserved, digit remains well-perfused, sensation intact distally  Right hand with appropriate range of motion, composite fist without significant restriction, sensation intact in all distributions, and/PIN/interosseous intact   Imaging: XR Finger Little Left Result Date: 03/21/2024 X-rays of the left small finger demonstrate well located DIP joint.  There is some evidence of erosive changes over the dorsal aspect of the middle phalanx, just proximal to the DIP region.  No acute fracture.   Past Medical/Family/Surgical/Social History: Medications & Allergies reviewed per EMR, new medications updated. 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   Vitamin D deficiency 03/02/2024   Finger pain, left 03/02/2024   Callus 10/23/2023   Pes planus 10/23/2023   Hyperlipidemia 01/22/2021   Type 2 diabetes mellitus with diabetic neuropathy, without long-term current use of insulin (HCC) 04/15/2018   Insomnia 04/15/2018   Numbness of left foot 04/15/2018   Type 2 diabetes mellitus (HCC) 04/15/2018   Acne vulgaris 03/05/2018    Elevated blood pressure reading 06/24/2017   Morbid obesity (HCC) 06/24/2017   Sleep-disordered breathing 06/24/2017   Chronic pain of both knees 06/24/2017   Slow transit constipation 12/16/2014   BMI 40.0-44.9, adult (HCC) 12/15/2014   SVT (supraventricular tachycardia) 12/13/2013   Essential hypertension 12/11/2013   Syncope, near 12/11/2013   Sleep apnea 11/08/2012   Allergic rhinitis 01/02/2009   Past Medical History:  Diagnosis Date   Asthma    Hypertension    No family history on file. Past Surgical History:  Procedure Laterality Date   ORIF TIBIA & FIBULA FRACTURES     SHOULDER ARTHROSCOPY     Social History   Occupational History   Not on file  Tobacco Use   Smoking status: Never    Passive exposure: Never   Smokeless tobacco: Never  Substance and Sexual Activity   Alcohol use: Yes    Comment: occasional   Drug use: No   Sexual activity: Not on file    Zakaiya Lares Afton Alderton, M.D. [facility], Hand Surgery

## 2024-03-21 ENCOUNTER — Ambulatory Visit (INDEPENDENT_AMBULATORY_CARE_PROVIDER_SITE_OTHER): Admitting: Orthopedic Surgery

## 2024-03-21 ENCOUNTER — Other Ambulatory Visit

## 2024-03-21 DIAGNOSIS — M79642 Pain in left hand: Secondary | ICD-10-CM

## 2024-03-21 DIAGNOSIS — M79641 Pain in right hand: Secondary | ICD-10-CM

## 2024-03-25 ENCOUNTER — Encounter (HOSPITAL_BASED_OUTPATIENT_CLINIC_OR_DEPARTMENT_OTHER): Payer: Self-pay | Admitting: Family Medicine

## 2024-03-25 DIAGNOSIS — M109 Gout, unspecified: Secondary | ICD-10-CM

## 2024-03-27 NOTE — Telephone Encounter (Signed)
 Please see mychart message sent by pt and advise.

## 2024-04-14 ENCOUNTER — Ambulatory Visit (HOSPITAL_BASED_OUTPATIENT_CLINIC_OR_DEPARTMENT_OTHER): Admitting: Family Medicine

## 2024-05-04 ENCOUNTER — Other Ambulatory Visit (HOSPITAL_BASED_OUTPATIENT_CLINIC_OR_DEPARTMENT_OTHER): Payer: Self-pay | Admitting: Family Medicine

## 2024-05-04 DIAGNOSIS — K219 Gastro-esophageal reflux disease without esophagitis: Secondary | ICD-10-CM

## 2024-05-04 DIAGNOSIS — E1165 Type 2 diabetes mellitus with hyperglycemia: Secondary | ICD-10-CM

## 2024-05-04 MED ORDER — AMLODIPINE BESYLATE 5 MG PO TABS: 5.0000 mg | ORAL_TABLET | Freq: Every day | ORAL | 1 refills | Status: DC

## 2024-05-04 MED ORDER — LISINOPRIL-HYDROCHLOROTHIAZIDE 10-12.5 MG PO TABS: 1.0000 | ORAL_TABLET | Freq: Every day | ORAL | 1 refills | Status: AC

## 2024-05-04 MED ORDER — OMEPRAZOLE 40 MG PO CPDR: 40.0000 mg | DELAYED_RELEASE_CAPSULE | Freq: Every day | ORAL | 3 refills | Status: AC

## 2024-05-04 NOTE — Telephone Encounter (Signed)
 Refill requests received. When went to refill mounjaro , message popped up stating that preferred was trulicity. Routing to Dr. De Cuba for review.

## 2024-05-04 NOTE — Telephone Encounter (Signed)
 Copied from CRM [id number]. Topic: Clinical - Medication Refill >> May 04, 2024 11:04 AM Fonda T wrote: Medication:  amLODipine  (NORVASC ) 5 MG tablet  lisinopril -hydrochlorothiazide  (ZESTORETIC ) 10-12.5 MG tablet  omeprazole  (PRILOSEC) 40 MG capsule  tirzepatide  (MOUNJARO ) 2.5 MG/0.5ML Pen   Has the patient contacted their pharmacy? Yes, advised to contact office, as needs all medications scripts sent/submitted, due to update in insurance and pharmacy, pt spoke to [facility] at [phone].   This is the patient's preferred pharmacy:   [facility] - [place], [place] - [address] [place] [address] Phone: [phone] Fax: [phone]  Is this the correct pharmacy for this prescription? Yes If no, delete pharmacy and type the correct one.   Has the prescription been filled recently? Yes  Is the patient out of the medication? Yes, pt not taking medication due to insurance change/update  Has the patient been seen for an appointment in the last year OR does the patient have an upcoming appointment? Yes  Can we respond through MyChart? Yes, either Mychart or call back at [phone]   Agent: Please be advised that Rx refills may take up to 3 business days. We ask that you follow-up with your pharmacy.

## 2024-05-19 ENCOUNTER — Ambulatory Visit (INDEPENDENT_AMBULATORY_CARE_PROVIDER_SITE_OTHER): Admitting: Family Medicine

## 2024-05-19 ENCOUNTER — Encounter (HOSPITAL_BASED_OUTPATIENT_CLINIC_OR_DEPARTMENT_OTHER): Payer: Self-pay | Admitting: Family Medicine

## 2024-05-19 VITALS — BP 163/84 | HR 90 | Temp 98.9°F | Resp 18 | Ht 75.0 in | Wt 345.0 lb

## 2024-05-19 DIAGNOSIS — E1165 Type 2 diabetes mellitus with hyperglycemia: Secondary | ICD-10-CM | POA: Diagnosis not present

## 2024-05-19 DIAGNOSIS — I10 Essential (primary) hypertension: Secondary | ICD-10-CM | POA: Diagnosis not present

## 2024-05-19 DIAGNOSIS — L989 Disorder of the skin and subcutaneous tissue, unspecified: Secondary | ICD-10-CM | POA: Insufficient documentation

## 2024-05-19 DIAGNOSIS — L219 Seborrheic dermatitis, unspecified: Secondary | ICD-10-CM | POA: Insufficient documentation

## 2024-05-19 DIAGNOSIS — M109 Gout, unspecified: Secondary | ICD-10-CM | POA: Diagnosis not present

## 2024-05-19 MED ORDER — AMLODIPINE BESYLATE 10 MG PO TABS: 10.0000 mg | ORAL_TABLET | Freq: Every day | ORAL | 1 refills | Status: AC

## 2024-05-19 MED ORDER — TRULICITY 0.75 MG/0.5ML ~~LOC~~ SOAJ: 0.7500 mg | SUBCUTANEOUS | 2 refills | Status: AC

## 2024-05-19 MED ORDER — METFORMIN HCL 500 MG PO TABS: 500.0000 mg | ORAL_TABLET | Freq: Two times a day (BID) | ORAL | 1 refills | Status: AC

## 2024-05-19 NOTE — Assessment & Plan Note (Signed)
 At last visit, we did look to start Mounjaro , however patient reports that he did not receive this primarily due to insurance not authorizing medication.  He is still interested in starting GLP-1 medication, we will look to start Trulicity  as he has had side effects with Ozempic in the past.  He also notes that he does not have any metformin  currently, prescription sent to pharmacy on file for this. We will check A1c today for monitoring, however I do suspect that it will be notably elevated given that it was elevated previously and he has not been taking any current medications prescribed previously. Check urine ACR at future visit Complete foot exam at future visit

## 2024-05-19 NOTE — Progress Notes (Signed)
 "   Procedures performed today:    None.  Independent interpretation of notes and tests performed by another provider:   None.  Brief History, Exam, Impression, and Recommendations:    BP (!) 163/84 (BP Location: Left Arm, Patient Position: Sitting, Cuff Size: Large)   Pulse 90   Temp 98.9 F (37.2 C) (Oral)   Resp 18   Ht 6' 3 (1.905 m)   Wt (!) 345 lb (156.5 kg)   SpO2 99%   BMI 43.12 kg/m   Type 2 diabetes mellitus with hyperglycemia, without long-term current use of insulin (HCC) Assessment & Plan: At last visit, we did look to start Mounjaro , however patient reports that he did not receive this primarily due to insurance not authorizing medication.  He is still interested in starting GLP-1 medication, we will look to start Trulicity  as he has had side effects with Ozempic in the past.  He also notes that he does not have any metformin  currently, prescription sent to pharmacy on file for this. We will check A1c today for monitoring, however I do suspect that it will be notably elevated given that it was elevated previously and he has not been taking any current medications prescribed previously. Check urine ACR at future visit Complete foot exam at future visit  Orders: -     Hemoglobin A1c  Skin lesion Assessment & Plan: Notes skin lesion over right arm near elbow.  Requesting referral to dermatology, referral placed.  Orders: -     Ambulatory referral to Dermatology  Seborrheic dermatitis Assessment & Plan: Notes he has had some issues with dandruff.  Has been trying various over-the-counter remedies.  He would like to meet with dermatologist regarding this.  Referral placed today.  Orders: -     Ambulatory referral to Dermatology  Essential hypertension Assessment & Plan: Blood pressure elevated in office today.  We can proceed with medication adjustments at this time.  Can continue with lisinopril -hydrochlorothiazide  at current dose.  Will increase amlodipine   to 10 mg daily.  Cautioned on potential side effects. Recommend intermittent monitoring of blood pressure at home, DASH diet If blood pressure continues to be elevated at follow-up visit, likely would adjust dose of lisinopril -hydrochlorothiazide  as a next step   Gout, unspecified cause, unspecified chronicity, unspecified site Assessment & Plan: Had an episode of hand swelling while he was out of town.  Reports being told that it was related to gout, although patient has no history of gout.  Swelling is primarily on the right hand and wrist and led to restriction in range of motion.  He did have uric acid checked around time of that episode, however this was low at 4.  We did discuss today that during acute flare, it is possible that uric acid level could be low despite gout being present. Discussed considerations, he is not currently having any symptoms, we can check uric acid today for further evaluation.  Orders: -     Uric acid  Other orders -     metFORMIN  HCl; Take 1 tablet (500 mg total) by mouth 2 (two) times daily with a meal.  Dispense: 180 tablet; Refill: 1 -     Trulicity ; Inject 0.75 mg into the skin once a week.  Dispense: 2 mL; Refill: 2 -     amLODIPine  Besylate; Take 1 tablet (10 mg total) by mouth daily.  Dispense: 90 tablet; Refill: 1  Return in about 1 month (around 06/19/2024) for diabetes, med check, 40 minutes.  ___________________________________________ Mataya Kilduff de [place], MD, ABFM, CAQSM Primary Care and Sports Medicine [facility] "

## 2024-05-19 NOTE — Assessment & Plan Note (Signed)
 Had an episode of hand swelling while he was out of town.  Reports being told that it was related to gout, although patient has no history of gout.  Swelling is primarily on the right hand and wrist and led to restriction in range of motion.  He did have uric acid checked around time of that episode, however this was low at 4.  We did discuss today that during acute flare, it is possible that uric acid level could be low despite gout being present. Discussed considerations, he is not currently having any symptoms, we can check uric acid today for further evaluation.

## 2024-05-19 NOTE — Assessment & Plan Note (Signed)
 Blood pressure elevated in office today.  We can proceed with medication adjustments at this time.  Can continue with lisinopril -hydrochlorothiazide  at current dose.  Will increase amlodipine  to 10 mg daily.  Cautioned on potential side effects. Recommend intermittent monitoring of blood pressure at home, DASH diet If blood pressure continues to be elevated at follow-up visit, likely would adjust dose of lisinopril -hydrochlorothiazide  as a next step

## 2024-05-19 NOTE — Assessment & Plan Note (Signed)
 Notes he has had some issues with dandruff.  Has been trying various over-the-counter remedies.  He would like to meet with dermatologist regarding this.  Referral placed today.

## 2024-05-19 NOTE — Assessment & Plan Note (Signed)
 Notes skin lesion over right arm near elbow.  Requesting referral to dermatology, referral placed.

## 2024-05-20 LAB — HEMOGLOBIN A1C
Est. average glucose Bld gHb Est-mCnc: 292 mg/dL
Hgb A1c MFr Bld: 11.8 % — ABNORMAL HIGH (ref 4.8–5.6)

## 2024-05-20 LAB — URIC ACID: Uric Acid: 3.7 mg/dL — ABNORMAL LOW (ref 3.8–8.4)

## 2024-05-24 ENCOUNTER — Ambulatory Visit (HOSPITAL_BASED_OUTPATIENT_CLINIC_OR_DEPARTMENT_OTHER): Payer: Self-pay | Admitting: Family Medicine

## 2024-06-20 ENCOUNTER — Ambulatory Visit (HOSPITAL_BASED_OUTPATIENT_CLINIC_OR_DEPARTMENT_OTHER): Admitting: Family Medicine

## 2025-02-20 ENCOUNTER — Ambulatory Visit: Admitting: Physician Assistant
